# Patient Record
Sex: Male | Born: 1990 | Race: White | Hispanic: No | Marital: Married | Smoking: Former smoker
Health system: Southern US, Community
[De-identification: ages and names within clinical notes are randomized; demographics above are authoritative.]

## PROBLEM LIST (undated history)

## (undated) DIAGNOSIS — E079 Disorder of thyroid, unspecified: Secondary | ICD-10-CM

## (undated) HISTORY — PX: FOOT SURGERY: SHX648

---
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
## 2008-04-07 ENCOUNTER — Ambulatory Visit: Payer: Self-pay | Admitting: Family Medicine

## 2008-04-07 DIAGNOSIS — Z9889 Other specified postprocedural states: Secondary | ICD-10-CM | POA: Insufficient documentation

## 2008-04-08 ENCOUNTER — Telehealth (INDEPENDENT_AMBULATORY_CARE_PROVIDER_SITE_OTHER): Payer: Self-pay | Admitting: *Deleted

## 2008-04-22 ENCOUNTER — Encounter (INDEPENDENT_AMBULATORY_CARE_PROVIDER_SITE_OTHER): Payer: Self-pay | Admitting: Family Medicine

## 2010-08-15 ENCOUNTER — Emergency Department: Payer: Self-pay | Admitting: Emergency Medicine

## 2010-08-20 NOTE — Discharge Summary (Signed)
NAME:  Luis Bennett, Luis Bennett           ACCOUNT NO.:  [id number]   MEDICAL RECORD NO.:  [id number]          PATIENT TYPE:  INP   LOCATION:  6114                         FACILITY:  [facility]   PHYSICIAN:  Phineas Semen, P.A.   DATE OF BIRTH:  [birth year]   DATE OF ADMISSION:  03/12/2004  DATE OF DISCHARGE:  03/14/2004                                 DISCHARGE SUMMARY   ATTENDING PHYSICIAN:  Jimmye Norman, MD.   FINAL DIAGNOSES:  1.  Fall.  2.  Small splenic laceration.   HISTORY:  This is a 20 year old [place] male, who was running when he tripped  and fell in a hole.  He was brought to [facility] because he  was having abdominal pain.  Workup was performed and CT scans were done.  He  was noted to have a small splenic laceration and an L3 transverse process  fracture.  The patient was subsequently hospitalized.  Serial H&H's were  performed, which remained within normal range.  His hemoglobin was 14.3 and  hematocrit 42.4 on the 10th.  His hemoglobin was 13.2 and hematocrit 38.3 at  the time of discharge.  Overnight, the patient did well.  No significant  tenderness noted, and on exam in the morning, he was nontender.  The patient  had been up and walking about and had been tolerating p.o.'s without  difficulty.  At this point, he wanted to go home.  It was stressed with the  patient and the patient's mother that the patient should avoid any running,  heavy lifting, or any physical contact with other friends.  It was discussed  with that patient that if he should have any abdominal pain or feel like he  is going to faint that he should go to the emergency room right away.  At  this point, we will discharge him.  He will follow up on Tuesday the 13th of  December.  He is given a note for school to stay out of Physical Education  for four weeks.  He is subsequently discharged at this time in a  satisfactory, stable condition.       CL/MEDQ  D:  03/14/2004  T:  03/14/2004  Job:   [id number]   cc:   Jimmye Norman III, M.D.  [address]  [place]  [place] [address]  Fax: [phone]

## 2010-09-17 ENCOUNTER — Emergency Department: Payer: Self-pay | Admitting: Internal Medicine

## 2011-04-09 ENCOUNTER — Emergency Department: Payer: Self-pay | Admitting: Emergency Medicine

## 2011-04-15 ENCOUNTER — Ambulatory Visit: Payer: Self-pay | Admitting: Podiatry

## 2011-04-19 LAB — WOUND CULTURE

## 2012-05-13 ENCOUNTER — Encounter (HOSPITAL_COMMUNITY): Payer: Self-pay | Admitting: Emergency Medicine

## 2012-05-13 ENCOUNTER — Emergency Department (HOSPITAL_COMMUNITY)
Admission: EM | Admit: 2012-05-13 | Discharge: 2012-05-13 | Disposition: A | Discharge status: Home / Self Care | Payer: Self-pay | Attending: Emergency Medicine | Admitting: Emergency Medicine

## 2012-05-13 DIAGNOSIS — K089 Disorder of teeth and supporting structures, unspecified: Secondary | ICD-10-CM | POA: Insufficient documentation

## 2012-05-13 DIAGNOSIS — F17200 Nicotine dependence, unspecified, uncomplicated: Secondary | ICD-10-CM | POA: Insufficient documentation

## 2012-05-13 DIAGNOSIS — K0889 Other specified disorders of teeth and supporting structures: Secondary | ICD-10-CM

## 2012-05-13 DIAGNOSIS — G8911 Acute pain due to trauma: Secondary | ICD-10-CM | POA: Insufficient documentation

## 2012-05-13 DIAGNOSIS — R221 Localized swelling, mass and lump, neck: Secondary | ICD-10-CM | POA: Insufficient documentation

## 2012-05-13 DIAGNOSIS — R220 Localized swelling, mass and lump, head: Secondary | ICD-10-CM | POA: Insufficient documentation

## 2012-05-13 MED ORDER — OXYCODONE-ACETAMINOPHEN 5-325 MG PO TABS
1.0000 | ORAL_TABLET | Freq: Once | ORAL | Status: AC
  Administered 2012-05-13: 1 via ORAL
  Filled 2012-05-13: qty 1

## 2012-05-13 MED ORDER — HYDROCODONE-ACETAMINOPHEN 5-325 MG PO TABS: 2.0000 | ORAL_TABLET | ORAL | Status: DC | PRN

## 2012-05-13 MED ORDER — PENICILLIN V POTASSIUM 500 MG PO TABS: 500.0000 mg | ORAL_TABLET | Freq: Four times a day (QID) | ORAL | Status: AC
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
## 2012-05-14 NOTE — ED Provider Notes (Signed)
History/physical exam/procedure(s) were performed by non-physician practitioner and as supervising physician I was immediately available for consultation/collaboration. I have reviewed all notes and am in agreement with care and plan.   Shalona Harbour S Gohan Collister, MD 05/14/12 1548 
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
## 2015-01-30 DIAGNOSIS — K5909 Other constipation: Secondary | ICD-10-CM | POA: Insufficient documentation

## 2015-01-30 DIAGNOSIS — E039 Hypothyroidism, unspecified: Secondary | ICD-10-CM | POA: Insufficient documentation

## 2017-07-07 ENCOUNTER — Ambulatory Visit (INDEPENDENT_AMBULATORY_CARE_PROVIDER_SITE_OTHER): Payer: BLUE CROSS/BLUE SHIELD | Admitting: Physician Assistant

## 2017-07-07 ENCOUNTER — Encounter: Payer: Self-pay | Admitting: Physician Assistant

## 2017-07-07 VITALS — BP 122/82 | HR 68 | Temp 98.3°F | Resp 16 | Ht 72.0 in | Wt 222.0 lb

## 2017-07-07 DIAGNOSIS — Z23 Encounter for immunization: Secondary | ICD-10-CM

## 2017-07-07 DIAGNOSIS — Z114 Encounter for screening for human immunodeficiency virus [HIV]: Secondary | ICD-10-CM | POA: Diagnosis not present

## 2017-07-07 DIAGNOSIS — E039 Hypothyroidism, unspecified: Secondary | ICD-10-CM

## 2017-07-07 DIAGNOSIS — D649 Anemia, unspecified: Secondary | ICD-10-CM

## 2017-07-07 DIAGNOSIS — R1084 Generalized abdominal pain: Secondary | ICD-10-CM

## 2017-07-07 DIAGNOSIS — Z13220 Encounter for screening for lipoid disorders: Secondary | ICD-10-CM

## 2017-07-07 DIAGNOSIS — Z131 Encounter for screening for diabetes mellitus: Secondary | ICD-10-CM | POA: Diagnosis not present

## 2017-07-07 NOTE — Patient Instructions (Signed)
Take Zantac 150 mg one half hour before breakfast and dinner.  Hypothyroidism Hypothyroidism is a disorder of the thyroid. The thyroid is a large gland that is located in the lower front of the neck. The thyroid releases hormones that control how the body works. With hypothyroidism, the thyroid does not make enough of these hormones. What are the causes? Causes of hypothyroidism may include:  Viral infections.  Pregnancy.  Your own defense system (immune system) attacking your thyroid.  Certain medicines.  Birth defects.  Past radiation treatments to your head or neck.  Past treatment with radioactive iodine.  Past surgical removal of part or all of your thyroid.  Problems with the gland that is located in the center of your brain (pituitary).  What are the signs or symptoms? Signs and symptoms of hypothyroidism may include:  Feeling as though you have no energy (lethargy).  Inability to tolerate cold.  Weight gain that is not explained by a change in diet or exercise habits.  Dry skin.  Coarse hair.  Menstrual irregularity.  Slowing of thought processes.  Constipation.  Sadness or depression.  How is this diagnosed? Your health care provider may diagnose hypothyroidism with blood tests and ultrasound tests. How is this treated? Hypothyroidism is treated with medicine that replaces the hormones that your body does not make. After you begin treatment, it may take several weeks for symptoms to go away. Follow these instructions at home:  Take medicines only as directed by your health care provider.  If you start taking any new medicines, tell your health care provider.  Keep all follow-up visits as directed by your health care provider. This is important. As your condition improves, your dosage needs may change. You will need to have blood tests regularly so that your health care provider can watch your condition. Contact a health care provider if:  Your  symptoms do not get better with treatment.  You are taking thyroid replacement medicine and: ? You sweat excessively. ? You have tremors. ? You feel anxious. ? You lose weight rapidly. ? You cannot tolerate heat. ? You have emotional swings. ? You have diarrhea. ? You feel weak. Get help right away if:  You develop chest pain.  You develop an irregular heartbeat.  You develop a rapid heartbeat. This information is not intended to replace advice given to you by your health care provider. Make sure you discuss any questions you have with your health care provider. Document Released: 03/21/2005 Document Revised: 08/27/2015 Document Reviewed: 08/06/2013 Elsevier Interactive Patient Education  2018 [employer]

## 2017-07-07 NOTE — Progress Notes (Signed)
Patient: Luis Bennett Male    DOB: 17-May-1990   27 y.o.   MRN: [id number] Visit Date: 07/07/2017  Today's Provider: Trey Sailors, PA-C   Chief Complaint  Patient presents with  . Establish Care  . Hypothyroidism   Subjective:    Luis Bennett is a 27 y/o man presenting today to establish care. He was previously seen at [facility], last visit 2017. He is living in [place] with his wife who is pregnant with their first child, due in October 2019. He is a Games developer. He does not recall having a tetanus shot.  He has a history of hypothyroidism. Last TSH 09/2015 from [place] was 7.742 on 100 mcg synthroid. He has currently been out of his synthroid for 2-3 weeks. He reports constipation and sluggishness as side effects.   He also reports abdominal pain. He reports he has had longstanding "stomach pains" since childhood, where food has always hurt him. More recently, he reports immediate epigastric pain after eating, occasionally followed by vomiting several hours later. Denies bloody emesis or diarrhea. He typically experiences these symptoms at night, where he may be eating [employer], [employer], or [employer], however he reports experiencing these symptoms with liquids as well. Denies NSAID use. Has not tried anything for this.  He is due for Tdap today.  Thyroid Problem  Presents for follow-up visit. Symptoms include constipation, diarrhea and fatigue. Patient reports no diaphoresis.          No Known Allergies   Current Outpatient Medications:  .  levothyroxine (SYNTHROID, LEVOTHROID) 100 MCG tablet, TAKE 1 TABLET BY MOUTH IN THE MORNING 30-60 MINUTES BEFORE EATING OR DRINKING, Disp: , Rfl:   Review of Systems  Constitutional: Positive for activity change and fatigue. Negative for appetite change, chills, diaphoresis, fever and unexpected weight change.  HENT: Negative.   Respiratory: Negative.   Cardiovascular: Negative.   Gastrointestinal:  Positive for abdominal distention, abdominal pain, constipation, diarrhea, nausea and vomiting. Negative for anal bleeding, blood in stool and rectal pain.  Endocrine: Negative.   Genitourinary: Negative.   Musculoskeletal: Negative.   Skin: Negative.   Allergic/Immunologic: Negative.   Neurological: Positive for dizziness. Negative for light-headedness and headaches.    Social History   Tobacco Use  . Smoking status: Current Every Day Smoker    Packs/day: 0.50    Types: Cigarettes  . Smokeless tobacco: Never Used  Substance Use Topics  . Alcohol use: No   Objective:   BP 122/82 (BP Location: Right Arm, Patient Position: Sitting, Cuff Size: Large)   Pulse 68   Temp 98.3 F (36.8 C) (Oral)   Resp 16   Ht 6' (1.829 m)   Wt 222 lb (100.7 kg)   BMI 30.11 kg/m  Vitals:   07/07/17 1017  BP: 122/82  Pulse: 68  Resp: 16  Temp: 98.3 F (36.8 C)  TempSrc: Oral  Weight: 222 lb (100.7 kg)  Height: 6' (1.829 m)     Physical Exam  Constitutional: He is oriented to person, place, and time. He appears well-developed and well-nourished.  Neck: No thyromegaly present.  Cardiovascular: Normal rate and regular rhythm.  Pulmonary/Chest: Effort normal and breath sounds normal.  Abdominal: Soft. Bowel sounds are normal.  Neurological: He is alert and oriented to person, place, and time.  Skin: Skin is warm and dry.  Psychiatric: He has a normal mood and affect. His behavior is normal.        Assessment &  Plan:     1. Hypothyroidism, unspecified type  Check TSH, expect this to be elevated. Reinitiate at 100 mcg and will f/u in 6 weeks to titrate medication.  - TSH  2. Generalized abdominal pain  Heartburn vs. Gallstones. Will get RUQ us to r/o gallstones/sludge. Counseled on dietary modifications and use of zantac to treat any heartburn symptoms. However, symptoms have been longstanding, and thus will refer to GI for further workup.  - US Abdomen Limited RUQ; Future -  Ambulatory referral to Gastroenterology  3. Encounter for screening for HIV  - HIV antibody (with reflex)  4. Diabetes mellitus screening  - Comprehensive Metabolic Panel (CMET)  5. Lipid screening  - Lipid Profile  6. Anemia, unspecified type  - CBC with Differential  7. Need for Tdap vaccination  Updated today.  Return in about 6 weeks (around 08/18/2017) for thyroid .  The entirety of the information documented in the History of Present Illness, Review of Systems and Physical Exam were personally obtained by me. Portions of this information were initially documented by Kavin LeechLaura Walsh, CMA and reviewed by me for thoroughness and accuracy.          Trey SailorsAdriana M Pollak, PA-C  [facility]

## 2017-07-10 ENCOUNTER — Telehealth: Payer: Self-pay

## 2017-07-10 ENCOUNTER — Ambulatory Visit
Admission: RE | Admit: 2017-07-10 | Discharge: 2017-07-10 | Disposition: A | Discharge status: Home / Self Care | Payer: BLUE CROSS/BLUE SHIELD | Source: Ambulatory Visit | Attending: Physician Assistant | Admitting: Physician Assistant

## 2017-07-10 DIAGNOSIS — R1084 Generalized abdominal pain: Secondary | ICD-10-CM | POA: Diagnosis not present

## 2017-07-10 NOTE — Telephone Encounter (Signed)
-----   Message from Trey SailorsAdriana M Pollak, [place]PA-C sent at 07/10/2017  1:50 PM EDT ----- Normal US, no gallstones. Please keep follow up with GI.

## 2017-07-10 NOTE — Telephone Encounter (Signed)
Pt advised.   Thanks,   -Brixton Franko  

## 2017-07-11 ENCOUNTER — Telehealth: Payer: Self-pay

## 2017-07-11 ENCOUNTER — Other Ambulatory Visit: Payer: Self-pay | Admitting: Physician Assistant

## 2017-07-11 DIAGNOSIS — E039 Hypothyroidism, unspecified: Secondary | ICD-10-CM

## 2017-07-11 LAB — COMPREHENSIVE METABOLIC PANEL
ALT: 32 IU/L (ref 0–44)
AST: 22 IU/L (ref 0–40)
Albumin/Globulin Ratio: 1.8 (ref 1.2–2.2)
Albumin: 4.5 g/dL (ref 3.5–5.5)
Alkaline Phosphatase: 29 IU/L — ABNORMAL LOW (ref 39–117)
BUN/Creatinine Ratio: 9 (ref 9–20)
BUN: 11 mg/dL (ref 6–20)
Bilirubin Total: 0.3 mg/dL (ref 0.0–1.2)
CO2: 24 mmol/L (ref 20–29)
Calcium: 9.2 mg/dL (ref 8.7–10.2)
Chloride: 103 mmol/L (ref 96–106)
Creatinine, Ser: 1.22 mg/dL (ref 0.76–1.27)
GFR calc Af Amer: 93 mL/min/{1.73_m2} (ref >59)
GFR calc non Af Amer: 80 mL/min/{1.73_m2} (ref >59)
Globulin, Total: 2.5 g/dL (ref 1.5–4.5)
Glucose: 103 mg/dL — ABNORMAL HIGH (ref 65–99)
Potassium: 4.2 mmol/L (ref 3.5–5.2)
Sodium: 141 mmol/L (ref 134–144)
Total Protein: 7 g/dL (ref 6.0–8.5)

## 2017-07-11 LAB — CBC WITH DIFFERENTIAL/PLATELET
Basophils Absolute: 0 10*3/uL (ref 0.0–0.2)
Basos: 0 %
EOS (ABSOLUTE): 0.1 10*3/uL (ref 0.0–0.4)
Eos: 2 %
Hematocrit: 42.7 % (ref 37.5–51.0)
Hemoglobin: 14 g/dL (ref 13.0–17.7)
Immature Grans (Abs): 0 10*3/uL (ref 0.0–0.1)
Immature Granulocytes: 0 %
Lymphocytes Absolute: 1.4 10*3/uL (ref 0.7–3.1)
Lymphs: 25 %
MCH: 30.6 pg (ref 26.6–33.0)
MCHC: 32.8 g/dL (ref 31.5–35.7)
MCV: 93 fL (ref 79–97)
Monocytes Absolute: 0.4 10*3/uL (ref 0.1–0.9)
Monocytes: 6 %
Neutrophils Absolute: 3.7 10*3/uL (ref 1.4–7.0)
Neutrophils: 67 %
Platelets: 138 10*3/uL — ABNORMAL LOW (ref 150–379)
RBC: 4.57 x10E6/uL (ref 4.14–5.80)
RDW: 13.1 % (ref 12.3–15.4)
WBC: 5.6 10*3/uL (ref 3.4–10.8)

## 2017-07-11 LAB — LIPID PANEL
Chol/HDL Ratio: 4.4 ratio (ref 0.0–5.0)
Cholesterol, Total: 194 mg/dL (ref 100–199)
HDL: 44 mg/dL (ref >39)
LDL Calculated: 124 mg/dL — ABNORMAL HIGH (ref 0–99)
Triglycerides: 131 mg/dL (ref 0–149)
VLDL Cholesterol Cal: 26 mg/dL (ref 5–40)

## 2017-07-11 LAB — TSH: TSH: 94.83 u[IU]/mL — ABNORMAL HIGH (ref 0.450–4.500)

## 2017-07-11 LAB — HIV ANTIBODY (ROUTINE TESTING W REFLEX): HIV Screen 4th Generation wRfx: NONREACTIVE

## 2017-07-11 MED ORDER — LEVOTHYROXINE SODIUM 100 MCG PO TABS: ORAL_TABLET | ORAL | 1 refills | Status: DC

## 2017-07-11 NOTE — Telephone Encounter (Signed)
-----   Message from Trey SailorsAdriana M Pollak, [place]PA-C sent at 07/11/2017  9:19 AM EDT ----- TSH very high at 94, will restart synthroid at former dose of 100 mcg but we may need to increase this. Will see him at follow up for repeat lab. Sugar slightly elevated, we can check A1c for diabetes in office next visit. Other labwork normal.

## 2017-07-11 NOTE — Telephone Encounter (Signed)
Pt advised.   Thanks,   -Laura  

## 2017-07-11 NOTE — Addendum Note (Signed)
Addended by: Kavin LeechWALSH, Brenan Modesto E on: 07/11/2017 12:40 PM   Modules accepted: Orders

## 2017-08-03 ENCOUNTER — Ambulatory Visit (INDEPENDENT_AMBULATORY_CARE_PROVIDER_SITE_OTHER): Payer: BLUE CROSS/BLUE SHIELD | Admitting: Gastroenterology

## 2017-08-03 ENCOUNTER — Encounter: Payer: Self-pay | Admitting: Gastroenterology

## 2017-08-03 VITALS — BP 128/75 | HR 77 | Ht 72.0 in | Wt 222.4 lb

## 2017-08-03 DIAGNOSIS — R1013 Epigastric pain: Secondary | ICD-10-CM | POA: Diagnosis not present
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
## 2017-08-15 ENCOUNTER — Ambulatory Visit: Payer: BLUE CROSS/BLUE SHIELD | Admitting: Physician Assistant

## 2017-09-06 ENCOUNTER — Other Ambulatory Visit: Payer: Self-pay | Admitting: Physician Assistant

## 2017-09-06 DIAGNOSIS — E039 Hypothyroidism, unspecified: Secondary | ICD-10-CM

## 2017-09-06 NOTE — Telephone Encounter (Signed)
Apt made for 09/12/2017 at 8:20  Thanks,   -Vernona RiegerLaura

## 2017-09-06 NOTE — Telephone Encounter (Signed)
He needs f/u before more refills, no showed his last OV for thyroid f/u and labs.

## 2017-09-12 ENCOUNTER — Encounter: Payer: Self-pay | Admitting: Physician Assistant

## 2017-09-12 ENCOUNTER — Ambulatory Visit (INDEPENDENT_AMBULATORY_CARE_PROVIDER_SITE_OTHER): Payer: BLUE CROSS/BLUE SHIELD | Admitting: Physician Assistant

## 2017-09-12 VITALS — BP 124/76 | HR 68 | Temp 98.0°F | Resp 16 | Wt 225.0 lb

## 2017-09-12 DIAGNOSIS — E039 Hypothyroidism, unspecified: Secondary | ICD-10-CM

## 2017-09-12 DIAGNOSIS — R739 Hyperglycemia, unspecified: Secondary | ICD-10-CM | POA: Diagnosis not present

## 2017-09-12 NOTE — Progress Notes (Signed)
       Patient: Luis Bennett Male    DOB: 1990-06-06   27 y.o.   MRN: [id number] Visit Date: 09/12/2017  Today's Provider: Trey SailorsAdriana M Pollak, PA-C   Chief Complaint  Patient presents with  . Hypothyroidism    Follow up   Subjective:   Started on 100 mch QD synthroid last month. Taking one every day.Feels better. Due for TSH today.  Also hyperglycemic on fasting labs, needs follow up A1C.  Thyroid Problem  Presents for follow-up visit. Patient reports no anxiety, cold intolerance, constipation, depressed mood, diaphoresis, diarrhea, dry skin, fatigue, hair loss, heat intolerance, hoarse voice, leg swelling, menstrual problem, nail problem, palpitations, tremors, visual change, weight gain or weight loss. The symptoms have been improving.   No Known Allergies   Current Outpatient Medications:  .  levothyroxine (SYNTHROID, LEVOTHROID) 100 MCG tablet, TAKE 1 TABLET BY MOUTH IN THE MORNING 30-60 MINUTES BEFORE EATING OR DRINKING, Disp: 14 tablet, Rfl: 0 .  sulfacetamide (BLEPH-10) 10 % ophthalmic solution, Place two drops into the right eye 4 (four) times daily., Disp: , Rfl:   Review of Systems  Constitutional: Negative.  Negative for diaphoresis, fatigue, weight gain and weight loss.  HENT: Negative for hoarse voice.   Respiratory: Negative.   Cardiovascular: Negative.  Negative for palpitations.  Gastrointestinal: Negative.  Negative for constipation and diarrhea.  Endocrine: Negative for cold intolerance and heat intolerance.  Genitourinary: Negative for menstrual problem.  Musculoskeletal: Negative.   Neurological: Negative for dizziness, tremors, light-headedness and headaches.  Psychiatric/Behavioral: The patient is not nervous/anxious.     Social History   Tobacco Use  . Smoking status: Current Every Day Smoker    Packs/day: 0.50    Types: Cigarettes  . Smokeless tobacco: Never Used  Substance Use Topics  . Alcohol use: Yes    Alcohol/week: 1.8 oz    Types:  3 Cans of beer per week    Comment: Weekly   Objective:   BP 124/76 (BP Location: Right Arm, Patient Position: Sitting, Cuff Size: Large)   Pulse 68   Temp 98 F (36.7 C) (Oral)   Resp 16   Wt 225 lb (102.1 kg)   BMI 30.52 kg/m  Vitals:   09/12/17 0828  BP: 124/76  Pulse: 68  Resp: 16  Temp: 98 F (36.7 C)  TempSrc: Oral  Weight: 225 lb (102.1 kg)     Physical Exam  Constitutional: He is oriented to person, place, and time. He appears well-developed and well-nourished.  Neck: Neck supple. No thyromegaly present.  Cardiovascular: Normal rate and regular rhythm.  Pulmonary/Chest: Effort normal and breath sounds normal.  Neurological: He is alert and oriented to person, place, and time.  Skin: Skin is warm and dry.  Psychiatric: He has a normal mood and affect. His behavior is normal.        Assessment & Plan:     1. Acquired hypothyroidism  TSH 13. Increase from 100 mcg synthroid to 112 mcg synthroid QD.  - TSH  2. Hyperglycemia  - HgB A1c  Return in about 6 weeks (around 10/24/2017) for hypothyroidism .  The entirety of the information documented in the History of Present Illness, Review of Systems and Physical Exam were personally obtained by me. Portions of this information were initially documented by Kavin LeechLaura Walsh, CMA and reviewed by me for thoroughness and accuracy.        Trey SailorsAdriana M Pollak, PA-C  [facility]

## 2017-09-12 NOTE — Patient Instructions (Signed)
Hypothyroidism Hypothyroidism is a disorder of the thyroid. The thyroid is a large gland that is located in the lower front of the neck. The thyroid releases hormones that control how the body works. With hypothyroidism, the thyroid does not make enough of these hormones. What are the causes? Causes of hypothyroidism may include:  Viral infections.  Pregnancy.  Your own defense system (immune system) attacking your thyroid.  Certain medicines.  Birth defects.  Past radiation treatments to your head or neck.  Past treatment with radioactive iodine.  Past surgical removal of part or all of your thyroid.  Problems with the gland that is located in the center of your brain (pituitary).  What are the signs or symptoms? Signs and symptoms of hypothyroidism may include:  Feeling as though you have no energy (lethargy).  Inability to tolerate cold.  Weight gain that is not explained by a change in diet or exercise habits.  Dry skin.  Coarse hair.  Menstrual irregularity.  Slowing of thought processes.  Constipation.  Sadness or depression.  How is this diagnosed? Your health care provider may diagnose hypothyroidism with blood tests and ultrasound tests. How is this treated? Hypothyroidism is treated with medicine that replaces the hormones that your body does not make. After you begin treatment, it may take several weeks for symptoms to go away. Follow these instructions at home:  Take medicines only as directed by your health care provider.  If you start taking any new medicines, tell your health care provider.  Keep all follow-up visits as directed by your health care provider. This is important. As your condition improves, your dosage needs may change. You will need to have blood tests regularly so that your health care provider can watch your condition. Contact a health care provider if:  Your symptoms do not get better with treatment.  You are taking thyroid  replacement medicine and: ? You sweat excessively. ? You have tremors. ? You feel anxious. ? You lose weight rapidly. ? You cannot tolerate heat. ? You have emotional swings. ? You have diarrhea. ? You feel weak. Get help right away if:  You develop chest pain.  You develop an irregular heartbeat.  You develop a rapid heartbeat. This information is not intended to replace advice given to you by your health care provider. Make sure you discuss any questions you have with your health care provider. Document Released: 03/21/2005 Document Revised: 08/27/2015 Document Reviewed: 08/06/2013 Elsevier Interactive Patient Education  2018 [employer] Inc[employer]  

## 2017-09-13 ENCOUNTER — Telehealth: Payer: Self-pay

## 2017-09-13 LAB — HEMOGLOBIN A1C
Est. average glucose Bld gHb Est-mCnc: 100 mg/dL
Hgb A1c MFr Bld: 5.1 % (ref 4.8–5.6)

## 2017-09-13 LAB — TSH: TSH: 13.35 u[IU]/mL — ABNORMAL HIGH (ref 0.450–4.500)

## 2017-09-13 MED ORDER — LEVOTHYROXINE SODIUM 112 MCG PO TABS: 112.0000 ug | ORAL_TABLET | Freq: Every day | ORAL | 0 refills | Status: DC

## 2017-09-13 NOTE — Telephone Encounter (Signed)
Pt advised.   Thanks,   -Laura  

## 2017-09-13 NOTE — Telephone Encounter (Signed)
-----   Message from Trey SailorsAdriana M Pollak, [place]PA-C sent at 09/13/2017 11:24 AM EDT ----- TSH reduced but still high and requires a higher dose of levothyroxine. Will send in 112 mcg daily levothyroxine and see him at follow up in 6 weeks to recheck TSH.

## 2017-10-03 ENCOUNTER — Ambulatory Visit: Payer: BLUE CROSS/BLUE SHIELD | Admitting: Gastroenterology

## 2017-10-31 ENCOUNTER — Ambulatory Visit: Payer: BLUE CROSS/BLUE SHIELD | Admitting: Physician Assistant

## 2017-11-15 ENCOUNTER — Ambulatory Visit: Payer: BLUE CROSS/BLUE SHIELD | Admitting: Gastroenterology

## 2017-12-10 ENCOUNTER — Other Ambulatory Visit: Payer: Self-pay | Admitting: Physician Assistant

## 2017-12-10 DIAGNOSIS — E039 Hypothyroidism, unspecified: Secondary | ICD-10-CM

## 2017-12-11 NOTE — Telephone Encounter (Signed)
Patient needs to schedule follow up with Adriana for o.v. And labs within the next month.

## 2017-12-14 ENCOUNTER — Other Ambulatory Visit: Payer: Self-pay | Admitting: Physician Assistant

## 2017-12-14 DIAGNOSIS — E039 Hypothyroidism, unspecified: Secondary | ICD-10-CM

## 2018-01-11 ENCOUNTER — Other Ambulatory Visit: Payer: Self-pay | Admitting: Family Medicine

## 2018-01-11 DIAGNOSIS — E039 Hypothyroidism, unspecified: Secondary | ICD-10-CM

## 2018-01-11 NOTE — Telephone Encounter (Signed)
Please schedule follow up OV, No showed last appointment for thyroid. No more refills without OV.

## 2018-01-16 NOTE — Telephone Encounter (Signed)
[email or web address]

## 2018-02-12 ENCOUNTER — Other Ambulatory Visit: Payer: Self-pay | Admitting: Physician Assistant

## 2018-02-12 DIAGNOSIS — E039 Hypothyroidism, unspecified: Secondary | ICD-10-CM

## 2018-02-16 ENCOUNTER — Ambulatory Visit (INDEPENDENT_AMBULATORY_CARE_PROVIDER_SITE_OTHER): Payer: BLUE CROSS/BLUE SHIELD | Admitting: Physician Assistant

## 2018-02-16 ENCOUNTER — Encounter: Payer: Self-pay | Admitting: Physician Assistant

## 2018-02-16 VITALS — BP 120/86 | HR 66 | Temp 98.1°F | Resp 16 | Wt 227.0 lb

## 2018-02-16 DIAGNOSIS — E039 Hypothyroidism, unspecified: Secondary | ICD-10-CM

## 2018-02-16 DIAGNOSIS — Z720 Tobacco use: Secondary | ICD-10-CM | POA: Diagnosis not present

## 2018-02-16 DIAGNOSIS — Z23 Encounter for immunization: Secondary | ICD-10-CM

## 2018-02-16 NOTE — Patient Instructions (Signed)
Hypothyroidism Hypothyroidism is a disorder of the thyroid. The thyroid is a large gland that is located in the lower front of the neck. The thyroid releases hormones that control how the body works. With hypothyroidism, the thyroid does not make enough of these hormones. What are the causes? Causes of hypothyroidism may include:  Viral infections.  Pregnancy.  Your own defense system (immune system) attacking your thyroid.  Certain medicines.  Birth defects.  Past radiation treatments to your head or neck.  Past treatment with radioactive iodine.  Past surgical removal of part or all of your thyroid.  Problems with the gland that is located in the center of your brain (pituitary).  What are the signs or symptoms? Signs and symptoms of hypothyroidism may include:  Feeling as though you have no energy (lethargy).  Inability to tolerate cold.  Weight gain that is not explained by a change in diet or exercise habits.  Dry skin.  Coarse hair.  Menstrual irregularity.  Slowing of thought processes.  Constipation.  Sadness or depression.  How is this diagnosed? Your health care provider may diagnose hypothyroidism with blood tests and ultrasound tests. How is this treated? Hypothyroidism is treated with medicine that replaces the hormones that your body does not make. After you begin treatment, it may take several weeks for symptoms to go away. Follow these instructions at home:  Take medicines only as directed by your health care provider.  If you start taking any new medicines, tell your health care provider.  Keep all follow-up visits as directed by your health care provider. This is important. As your condition improves, your dosage needs may change. You will need to have blood tests regularly so that your health care provider can watch your condition. Contact a health care provider if:  Your symptoms do not get better with treatment.  You are taking thyroid  replacement medicine and: ? You sweat excessively. ? You have tremors. ? You feel anxious. ? You lose weight rapidly. ? You cannot tolerate heat. ? You have emotional swings. ? You have diarrhea. ? You feel weak. Get help right away if:  You develop chest pain.  You develop an irregular heartbeat.  You develop a rapid heartbeat. This information is not intended to replace advice given to you by your health care provider. Make sure you discuss any questions you have with your health care provider. Document Released: 03/21/2005 Document Revised: 08/27/2015 Document Reviewed: 08/06/2013 Elsevier Interactive Patient Education  2018 [employer] Inc[employer]  
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
## 2018-02-16 NOTE — Assessment & Plan Note (Addendum)
Continue daily for now. Check TSH and adjust accordingly.  TSH is 13. Increase synthroid to 125 mcg daily and will send this in.

## 2018-02-16 NOTE — Assessment & Plan Note (Signed)
Patient wants to quit cold [place]. Will reassess willingness for smoking cessation at next visit.

## 2018-02-21 LAB — TSH: TSH: 13.06 u[IU]/mL — ABNORMAL HIGH (ref 0.450–4.500)

## 2018-02-21 MED ORDER — LEVOTHYROXINE SODIUM 125 MCG PO TABS: 125.0000 ug | ORAL_TABLET | Freq: Every day | ORAL | 0 refills | Status: DC

## 2018-02-21 NOTE — Progress Notes (Signed)
125 mcg synthroid sent in, please take one tab daily and follow up.

## 2018-02-21 NOTE — Addendum Note (Signed)
Addended by: Trey SailorsPOLLAK, ADRIANA M on: 02/21/2018 11:42 AM   Modules accepted: Orders, Level of Service

## 2018-03-05 ENCOUNTER — Telehealth: Payer: Self-pay

## 2018-03-05 NOTE — Telephone Encounter (Signed)
Agreed - thank you.

## 2018-03-05 NOTE — Telephone Encounter (Signed)
Patient reports that he is having chest pain on the right side of his chest off an on. Reports this morning it started to hurt around 7 am and it went away at 9 am and reports it just started back again at 4 pm. Associated symptoms: Dizziness, lightheaded, no blurry vision. Patient was advised to go to the ED for further evaluation.

## 2018-03-13 ENCOUNTER — Ambulatory Visit (INDEPENDENT_AMBULATORY_CARE_PROVIDER_SITE_OTHER): Payer: BLUE CROSS/BLUE SHIELD | Admitting: Physician Assistant

## 2018-03-13 ENCOUNTER — Encounter: Payer: Self-pay | Admitting: Physician Assistant

## 2018-03-13 VITALS — BP 127/80 | HR 97 | Temp 97.8°F | Resp 16 | Wt 226.0 lb

## 2018-03-13 DIAGNOSIS — R079 Chest pain, unspecified: Secondary | ICD-10-CM

## 2018-03-13 DIAGNOSIS — E039 Hypothyroidism, unspecified: Secondary | ICD-10-CM | POA: Diagnosis not present

## 2018-03-13 DIAGNOSIS — I951 Orthostatic hypotension: Secondary | ICD-10-CM | POA: Diagnosis not present

## 2018-03-13 DIAGNOSIS — R42 Dizziness and giddiness: Secondary | ICD-10-CM | POA: Diagnosis not present

## 2018-03-13 DIAGNOSIS — F419 Anxiety disorder, unspecified: Secondary | ICD-10-CM

## 2018-03-13 NOTE — Progress Notes (Signed)
Patient: Luis Bennett Male    DOB: [birth year]   27 y.o.   MRN: [id number] Visit Date: 03/13/2018  Today's Provider: Trey Sailors, PA-C   Chief Complaint  Patient presents with  . Dizziness   Subjective:    Dizziness  This is a new problem. The current episode started 1 to 4 weeks ago (patient reports that since his Thyroid medication was increased he has been feeling ligheadnessoff an on). The problem occurs every several days. The problem has been unchanged. Pertinent negatives include no chills, congestion or coughing. Associated symptoms comments: Reports that he started googling dizziness and he started to "panic". Nothing aggravates the symptoms. He has tried nothing (Patient reports that he has cut down on the smoking and the chest pain episode that he was having it has been better.) for the symptoms.   Patient called 12/02 about having chest pain on the right side of his chest off and on. He was advised to go to the ER. He did not. Reports chest pain on right side while he was sitting in the car driving in the morning to work. Reports pain came went away and then came back later that night. Did not notice chest pain during activity that improves with rest.   Patient also reports episodes of dizziness and light headedness for 20 seconds randomly. He could be standing up walking. He also noticed it when he went to lie down and do some work under his car. He has never passed out. Denies SOB. Denies the room spinning.    He has recently had his synthroid increased due to elevated TSH and hypothyroidism. He reports he is taking this on an empty stomach with a full glass of orange juice.   GAD 7 : Generalized Anxiety Score 03/13/2018  Nervous, Anxious, on Edge 2  Control/stop worrying 3  Worry too much - different things 3  Trouble relaxing 3  Restless 3  Easily annoyed or irritable 3  Afraid - awful might happen 1  Total GAD 7 Score 18  Anxiety Difficulty Somewhat  difficult       No Known Allergies   Current Outpatient Medications:  .  levothyroxine (SYNTHROID, LEVOTHROID) 125 MCG tablet, Take 1 tablet (125 mcg total) by mouth daily., Disp: 90 tablet, Rfl: 0  Review of Systems  Constitutional: Negative for chills.  HENT: Negative for congestion.   Eyes: Negative for visual disturbance.  Respiratory: Negative for cough.   Neurological: Positive for dizziness and light-headedness.    Social History   Tobacco Use  . Smoking status: Current Every Day Smoker    Packs/day: 0.50    Types: Cigarettes  . Smokeless tobacco: Never Used  Substance Use Topics  . Alcohol use: Yes    Alcohol/week: 3.0 standard drinks    Types: 3 Cans of beer per week    Comment: Weekly   Objective:   BP 127/80 (BP Location: Left Arm, Patient Position: Sitting, Cuff Size: Large)   Pulse 97   Temp 97.8 F (36.6 C) (Oral)   Resp 16   Wt 226 lb (102.5 kg)   SpO2 99%   BMI 30.65 kg/m  Vitals:   03/13/18 1422  BP: 127/80  Pulse: 97  Resp: 16  Temp: 97.8 F (36.6 C)  TempSrc: Oral  SpO2: 99%  Weight: 226 lb (102.5 kg)     Physical Exam Constitutional:      Appearance: Normal appearance.  Cardiovascular:  Rate and Rhythm: Normal rate and regular rhythm.  Pulmonary:     Effort: Pulmonary effort is normal.     Breath sounds: Normal breath sounds.  Skin:    General: Skin is warm and dry.  Neurological:     Mental Status: He is alert and oriented to person, place, and time. Mental status is at baseline.  Psychiatric:        Mood and Affect: Mood is anxious.        Speech: Speech normal.        Behavior: Behavior normal.         Assessment & Plan:     1. Chest pain, unspecified type  His EKG and exam are normal.  - EKG 12-Lead  2. Orthostatic hypotension  Does have some orthostatic hypotension. Encouraged to drink plenty of fluids which patient reports he is not vigilant about.   3. Dizziness  - CBC with Differential - TSH -  Comprehensive Metabolic Panel (CMET)  4. Acquired hypothyroidism  Will check TSH. Counseled he should take this with water and not any source of calcium as it prevents absorption.  5. Anxiety  Patient scores very high on anxiety screening. He frequently asks about signs of cancer and if the blood tests I draw will see lung cancer or any type of cancer. He does continue to smoke. Talked about how anxiety could exacerbate some of these symptoms he is experiencing. Offered medications and counseling, patient declines for now. He wishes to engage in meditation and we talked about different strategies for this, including applications on his phone.  Return in about 6 weeks (around 04/24/2018) for hypothyroidism.  The entirety of the information documented in the History of Present Illness, Review of Systems and Physical Exam were personally obtained by me. Portions of this information were initially documented by Hetty ElyJoseline Rosas, CMA and reviewed by me for thoroughness and accuracy.   I have spent 25 minutes with this patient, >50% of which was spent on counseling and coordination of care.             Trey SailorsAdriana M Pollak, PA-C  [facility]

## 2018-03-13 NOTE — Patient Instructions (Signed)
Please drink lots of fluids!   Orthostatic Hypotension Orthostatic hypotension is a sudden drop in blood pressure that happens when you quickly change positions, such as when you get up from a seated or lying position. Blood pressure is a measurement of how strongly, or weakly, your blood is pressing against the walls of your arteries. Arteries are blood vessels that carry blood from your heart throughout your body. When blood pressure is too low, you may not get enough blood to your brain or to the rest of your organs. This can cause weakness, light-headedness, rapid heartbeat, and fainting. This can last for just a few seconds or for up to a few minutes. Orthostatic hypotension is usually not a serious problem. However, if it happens frequently or gets worse, it may be a sign of something more serious. What are the causes? This condition may be caused by:  Sudden changes in posture, such as standing up quickly after you have been sitting or lying down.  Blood loss.  Loss of body fluids (dehydration).  Heart problems.  Hormone (endocrine) problems.  Pregnancy.  Severe infection.  Lack of certain nutrients.  Severe allergic reactions (anaphylaxis).  Certain medicines, such as blood pressure medicine or medicines that make the body lose excess fluids (diuretics). Sometimes, this condition can be caused by not taking medicine as directed, such as taking too much of a certain medicine.  What increases the risk? Certain factors can make you more likely to develop orthostatic hypotension, including:  Age. Risk increases as you get older.  Conditions that affect the heart or the central nervous system.  Taking certain medicines, such as blood pressure medicine or diuretics.  Being pregnant.  What are the signs or symptoms? Symptoms of this condition may include:  Weakness.  Light-headedness.  Dizziness.  Blurred vision.  Fatigue.  Rapid heartbeat.  Fainting, in severe  cases.  How is this diagnosed? This condition is diagnosed based on:  Your medical history.  Your symptoms.  Your blood pressure measurement. Your health care provider will check your blood pressure when you are: ? Lying down. ? Sitting. ? Standing.  A blood pressure reading is recorded as two numbers, such as "120 over 80" (or 120/80). The first ("top") number is called the systolic pressure. It is a measure of the pressure in your arteries as your heart beats. The second ("bottom") number is called the diastolic pressure. It is a measure of the pressure in your arteries when your heart relaxes between beats. Blood pressure is measured in a unit called mm Hg. Healthy blood pressure for adults is 120/80. If your blood pressure is below 90/60, you may be diagnosed with hypotension. Other information or tests that may be used to diagnose orthostatic hypotension include:  Your other vital signs, such as your heart rate and temperature.  Blood tests.  Tilt table test. For this test, you will be safely secured to a table that moves you from a lying position to an upright position. Your heart rhythm and blood pressure will be monitored during the test.  How is this treated? Treatment for this condition may include:  Changing your diet. This may involve eating more salt (sodium) or drinking more water.  Taking medicines to raise your blood pressure.  Changing the dosage of certain medicines you are taking that might be lowering your blood pressure.  Wearing compression stockings. These stockings help to prevent blood clots and reduce swelling in your legs.  In some cases, you may need  to go to the hospital for:  Fluid replacement. This means you will receive fluids through an IV tube.  Blood replacement. This means you will receive donated blood through an IV tube (transfusion).  Treating an infection or heart problems, if this applies.  Monitoring. You may need to be monitored  while medicines that you are taking wear off.  Follow these instructions at home: Eating and drinking   Drink enough fluid to keep your urine clear or pale yellow.  Eat a healthy diet and follow instructions from your health care provider about eating or drinking restrictions. A healthy diet includes: ? Fresh fruits and vegetables. ? Whole grains. ? Lean meats. ? Low-fat dairy products.  Eat extra salt only as directed. Do not add extra salt to your diet unless your health care provider told you to do that.  Eat frequent, small meals.  Avoid standing up suddenly after eating. Medicines  Take over-the-counter and prescription medicines only as told by your health care provider. ? Follow instructions from your health care provider about changing the dosage of your current medicines, if this applies. ? Do not stop or adjust any of your medicines on your own. General instructions  Wear compression stockings as told by your health care provider.  Get up slowly from lying down or sitting positions. This gives your blood pressure a chance to adjust.  Avoid hot showers and excessive heat as directed by your health care provider.  Return to your normal activities as told by your health care provider. Ask your health care provider what activities are safe for you.  Do not use any products that contain nicotine or tobacco, such as cigarettes and e-cigarettes. If you need help quitting, ask your health care provider.  Keep all follow-up visits as told by your health care provider. This is important. Contact a health care provider if:  You vomit.  You have diarrhea.  You have a fever for more than 2-3 days.  You feel more thirsty than usual.  You feel weak and tired. Get help right away if:  You have chest pain.  You have a fast or irregular heartbeat.  You develop numbness in any part of your body.  You cannot move your arms or your legs.  You have trouble  speaking.  You become sweaty or feel lightheaded.  You faint.  You feel short of breath.  You have trouble staying awake.  You feel confused. This information is not intended to replace advice given to you by your health care provider. Make sure you discuss any questions you have with your health care provider. Document Released: 03/11/2002 Document Revised: 12/08/2015 Document Reviewed: 09/11/2015 Elsevier Interactive Patient Education  2018 [employer]

## 2018-03-14 ENCOUNTER — Telehealth: Payer: Self-pay

## 2018-03-14 LAB — CBC WITH DIFFERENTIAL/PLATELET
Basophils Absolute: 0 10*3/uL (ref 0.0–0.2)
Basos: 1 %
EOS (ABSOLUTE): 0.1 10*3/uL (ref 0.0–0.4)
Eos: 1 %
Hematocrit: 40 % (ref 37.5–51.0)
Hemoglobin: 14.2 g/dL (ref 13.0–17.7)
Immature Grans (Abs): 0.1 10*3/uL (ref 0.0–0.1)
Immature Granulocytes: 1 %
Lymphocytes Absolute: 1.6 10*3/uL (ref 0.7–3.1)
Lymphs: 23 %
MCH: 31.1 pg (ref 26.6–33.0)
MCHC: 35.5 g/dL (ref 31.5–35.7)
MCV: 88 fL (ref 79–97)
Monocytes Absolute: 0.5 10*3/uL (ref 0.1–0.9)
Monocytes: 8 %
Neutrophils Absolute: 4.6 10*3/uL (ref 1.4–7.0)
Neutrophils: 66 %
Platelets: 139 10*3/uL — ABNORMAL LOW (ref 150–450)
RBC: 4.56 x10E6/uL (ref 4.14–5.80)
RDW: 12.2 % — ABNORMAL LOW (ref 12.3–15.4)
WBC: 7 10*3/uL (ref 3.4–10.8)

## 2018-03-14 LAB — COMPREHENSIVE METABOLIC PANEL
ALT: 61 IU/L — ABNORMAL HIGH (ref 0–44)
AST: 42 IU/L — ABNORMAL HIGH (ref 0–40)
Albumin/Globulin Ratio: 2.1 (ref 1.2–2.2)
Albumin: 5 g/dL (ref 3.5–5.5)
Alkaline Phosphatase: 31 IU/L — ABNORMAL LOW (ref 39–117)
BUN/Creatinine Ratio: 11 (ref 9–20)
BUN: 15 mg/dL (ref 6–20)
Bilirubin Total: 0.3 mg/dL (ref 0.0–1.2)
CO2: 24 mmol/L (ref 20–29)
Calcium: 9.9 mg/dL (ref 8.7–10.2)
Chloride: 102 mmol/L (ref 96–106)
Creatinine, Ser: 1.4 mg/dL — ABNORMAL HIGH (ref 0.76–1.27)
GFR calc Af Amer: 78 mL/min/{1.73_m2} (ref >59)
GFR calc non Af Amer: 67 mL/min/{1.73_m2} (ref >59)
Globulin, Total: 2.4 g/dL (ref 1.5–4.5)
Glucose: 84 mg/dL (ref 65–99)
Potassium: 3.9 mmol/L (ref 3.5–5.2)
Sodium: 139 mmol/L (ref 134–144)
Total Protein: 7.4 g/dL (ref 6.0–8.5)

## 2018-03-14 LAB — TSH: TSH: 7.3 u[IU]/mL — ABNORMAL HIGH (ref 0.450–4.500)

## 2018-03-14 NOTE — Telephone Encounter (Signed)
Patient advised as directed below. 

## 2018-03-14 NOTE — Telephone Encounter (Signed)
-----   Message from Trey SailorsAdriana M Pollak, [place] sent at 03/14/2018  8:24 AM EST ----- His blood counts are normal, no anemia or low blood count. TSH is improved at 7.30. Be sure to take the medication with a full glass of water on an empty stomach and NOT orange juice or milk or anything else with calcium. Kidney function is a little low, likely dehydration. Liver enzymes are just slightly elevated. Recommend staying away from alcohol and tylenol. Will keep thyroid medication at same dose for now and follow up in 3 months for labwork to recheck thyroid and liver.

## 2018-05-25 ENCOUNTER — Ambulatory Visit: Payer: Self-pay | Admitting: Physician Assistant

## 2018-05-25 ENCOUNTER — Ambulatory Visit (INDEPENDENT_AMBULATORY_CARE_PROVIDER_SITE_OTHER): Payer: BLUE CROSS/BLUE SHIELD | Admitting: Physician Assistant

## 2018-05-25 ENCOUNTER — Encounter: Payer: Self-pay | Admitting: Physician Assistant

## 2018-05-25 VITALS — BP 120/72 | HR 61 | Temp 98.0°F | Resp 16 | Wt 229.0 lb

## 2018-05-25 DIAGNOSIS — E039 Hypothyroidism, unspecified: Secondary | ICD-10-CM

## 2018-05-25 DIAGNOSIS — Z87891 Personal history of nicotine dependence: Secondary | ICD-10-CM

## 2018-05-25 NOTE — Patient Instructions (Addendum)
Address: [address] [place], [place] [address]  Hours:  Open ? Closes 5PM Phone: [phone]    Hypothyroidism  Hypothyroidism is when the thyroid gland does not make enough of certain hormones (it is underactive). The thyroid gland is a small gland located in the lower front part of the neck, just in front of the windpipe (trachea). This gland makes hormones that help control how the body uses food for energy (metabolism) as well as how the heart and brain function. These hormones also play a role in keeping your bones strong. When the thyroid is underactive, it produces too little of the hormones thyroxine (T4) and triiodothyronine (T3). What are the causes? This condition may be caused by:  Hashimoto's disease. This is a disease in which the body's disease-fighting system (immune system) attacks the thyroid gland. This is the most common cause.  Viral infections.  Pregnancy.  Certain medicines.  Birth defects.  Past radiation treatments to the head or neck for cancer.  Past treatment with radioactive iodine.  Past exposure to radiation in the environment.  Past surgical removal of part or all of the thyroid.  Problems with a gland in the center of the brain (pituitary gland).  Lack of enough iodine in the diet. What increases the risk? You are more likely to develop this condition if:  You are male.  You have a family history of thyroid conditions.  You use a medicine called lithium.  You take medicines that affect the immune system (immunosuppressants). What are the signs or symptoms? Symptoms of this condition include:  Feeling as though you have no energy (lethargy).  Not being able to tolerate cold.  Weight gain that is not explained by a change in diet or exercise habits.  Lack of appetite.  Dry skin.  Coarse hair.  Menstrual irregularity.  Slowing of thought processes.  Constipation.  Sadness or depression. How is this diagnosed? This  condition may be diagnosed based on:  Your symptoms, your medical history, and a physical exam.  Blood tests. You may also have imaging tests, such as an ultrasound or MRI. How is this treated? This condition is treated with medicine that replaces the thyroid hormones that your body does not make. After you begin treatment, it may take several weeks for symptoms to go away. Follow these instructions at home:  Take over-the-counter and prescription medicines only as told by your health care provider.  If you start taking any new medicines, tell your health care provider.  Keep all follow-up visits as told by your health care provider. This is important. ? As your condition improves, your dosage of thyroid hormone medicine may change. ? You will need to have blood tests regularly so that your health care provider can monitor your condition. Contact a health care provider if:  Your symptoms do not get better with treatment.  You are taking thyroid replacement medicine and you: ? Sweat a lot. ? Have tremors. ? Feel anxious. ? Lose weight rapidly. ? Cannot tolerate heat. ? Have emotional swings. ? Have diarrhea. ? Feel weak. Get help right away if you have:  Chest pain.  An irregular heartbeat.  A rapid heartbeat.  Difficulty breathing. Summary  Hypothyroidism is when the thyroid gland does not make enough of certain hormones (it is underactive).  When the thyroid is underactive, it produces too little of the hormones thyroxine (T4) and triiodothyronine (T3).  The most common cause is Hashimoto's disease, a disease in which the body's disease-fighting  system (immune system) attacks the thyroid gland. The condition can also be caused by viral infections, medicine, pregnancy, or past radiation treatment to the head or neck.  Symptoms may include weight gain, dry skin, constipation, feeling as though you do not have energy, and not being able to tolerate cold.  This condition  is treated with medicine to replace the thyroid hormones that your body does not make. This information is not intended to replace advice given to you by your health care provider. Make sure you discuss any questions you have with your health care provider. Document Released: 03/21/2005 Document Revised: 03/01/2017 Document Reviewed: 03/01/2017 Elsevier Interactive Patient Education  2019 [employer].

## 2018-05-25 NOTE — Progress Notes (Signed)
Patient: Luis Bennett Male    DOB: 06-04-1990   28 y.o.   MRN: [id number] Visit Date: 05/25/2018  Today's Provider: Trey Sailors, PA-C   Chief Complaint  Patient presents with  . Hypothyroidism   Subjective:     HPI  Follow up for hypothyroidism  The patient was last seen for this 3 months ago. Changes made at last visit include check labs. Las visit he was taking 125 mcg synthroid and his TSH was high. He was taking his synthroid on an empty stomach but with a full glass of orange juice. Dosage of synthroid remained the same but patient was advised to take synthroid on empty stomach with water, no source of calcium. Patient says he has been doing this. Patient reports quitting smoking as well. Patient denies symptoms of hypothyroidism.  Lab Results  Component Value Date   TSH 7.300 (H) 03/13/2018     He reports excellent compliance with treatment. He feels that condition is Improved. He is not having side effects.   Wt Readings from Last 3 Encounters:  05/25/18 229 lb (103.9 kg)  03/13/18 226 lb (102.5 kg)  02/16/18 227 lb (103 kg)    ------------------------------------------------------------------------------------   No Known Allergies   Current Outpatient Medications:  .  levothyroxine (SYNTHROID, LEVOTHROID) 125 MCG tablet, Take 1 tablet (125 mcg total) by mouth daily., Disp: 90 tablet, Rfl: 0  Review of Systems  Constitutional: Negative for fatigue.  Cardiovascular: Negative.   Endocrine: Negative.  Negative for cold intolerance and heat intolerance.    Social History   Tobacco Use  . Smoking status: Current Every Day Smoker    Packs/day: 0.50    Types: Cigarettes  . Smokeless tobacco: Never Used  Substance Use Topics  . Alcohol use: Yes    Alcohol/week: 3.0 standard drinks    Types: 3 Cans of beer per week    Comment: Weekly      Objective:   BP 120/72 (BP Location: Left Arm, Patient Position: Sitting, Cuff Size: Large)    Pulse 61   Temp 98 F (36.7 C) (Oral)   Resp 16   Wt 229 lb (103.9 kg)   BMI 31.06 kg/m  Vitals:   05/25/18 1309  BP: 120/72  Pulse: 61  Resp: 16  Temp: 98 F (36.7 C)  TempSrc: Oral  Weight: 229 lb (103.9 kg)     Physical Exam Constitutional:      Appearance: Normal appearance. He is normal weight.  Neck:     Thyroid: No thyromegaly.  Cardiovascular:     Rate and Rhythm: Normal rate and regular rhythm.     Heart sounds: Normal heart sounds.  Pulmonary:     Effort: Pulmonary effort is normal.     Breath sounds: Normal breath sounds.  Neurological:     Mental Status: He is oriented to person, place, and time. Mental status is at baseline.  Psychiatric:        Mood and Affect: Mood normal.        Behavior: Behavior normal.         Assessment & Plan    1. Acquired hypothyroidism  Check TSH as below. We do not have lab services today, have directed patient to [facility] or he may come back on Monday to get labs. He is out of his synthroid but I would like to get his lab to make sure we fill the right dose. If stable, will follow up in  6 months to 1 year.   - TSH  2. History of tobacco abuse  He reports he has quit smoking.   Return in about 6 months (around 11/23/2018) for hpothyroidism .  The entirety of the information documented in the History of Present Illness, Review of Systems and Physical Exam were personally obtained by me. Portions of this information were initially documented by Rondel Baton, CMA and reviewed by me for thoroughness and accuracy.        Trey Sailors, PA-C  [facility]

## 2018-05-30 ENCOUNTER — Other Ambulatory Visit: Payer: Self-pay | Admitting: Physician Assistant

## 2018-05-30 ENCOUNTER — Telehealth: Payer: Self-pay | Admitting: *Deleted

## 2018-05-30 DIAGNOSIS — E039 Hypothyroidism, unspecified: Secondary | ICD-10-CM

## 2018-05-30 LAB — TSH: TSH: 18.63 u[IU]/mL — ABNORMAL HIGH (ref 0.450–4.500)

## 2018-05-30 MED ORDER — LEVOTHYROXINE SODIUM 150 MCG PO TABS: 150.0000 ug | ORAL_TABLET | Freq: Every day | ORAL | 3 refills | Status: DC

## 2018-05-30 NOTE — Telephone Encounter (Signed)
Patient was notified of results. Expressed understanding.  

## 2018-05-30 NOTE — Telephone Encounter (Signed)
Patient wanted to know if you think it made any difference on his TSH result because he had missed 5 days of the levothyroxine prior to test? Should he go ahead and start at the 150 mcg dose or 125 mcg? Please advise?

## 2018-05-30 NOTE — Telephone Encounter (Signed)
-----   Message from Trey Sailors, [place] sent at 05/30/2018 12:02 PM EST ----- Levels are actually worse. Will increase levothyroxine to 150 mcg daily. Please do take this on empty stomach 30 min prior to meals with NO source of calcium like juice or milk and NO other vitamins or supplements. Follow up 4 mo.

## 2018-05-31 NOTE — Telephone Encounter (Signed)
Patient advised as below.  

## 2018-05-31 NOTE — Telephone Encounter (Signed)
Hard for me to tell, it probably would make a difference but I don't know if it accounts for all of it. I would still do the 150 as it has always been a little high.

## 2018-09-10 ENCOUNTER — Telehealth: Payer: Self-pay | Admitting: Physician Assistant

## 2018-09-10 DIAGNOSIS — E039 Hypothyroidism, unspecified: Secondary | ICD-10-CM

## 2018-09-10 NOTE — Telephone Encounter (Signed)
Pt needs refill on   Levothyroxine 150 mcg  [facility]

## 2018-09-11 MED ORDER — LEVOTHYROXINE SODIUM 150 MCG PO TABS
150.0000 ug | ORAL_TABLET | Freq: Every day | ORAL | 3 refills | Status: DC
Start: 2018-09-11 — End: 2019-03-18

## 2018-09-13 ENCOUNTER — Other Ambulatory Visit: Payer: Self-pay | Admitting: Physician Assistant

## 2018-09-13 ENCOUNTER — Other Ambulatory Visit: Payer: Self-pay

## 2018-09-13 ENCOUNTER — Ambulatory Visit (INDEPENDENT_AMBULATORY_CARE_PROVIDER_SITE_OTHER): Payer: BC Managed Care – PPO | Admitting: Physician Assistant

## 2018-09-13 ENCOUNTER — Encounter: Payer: Self-pay | Admitting: Physician Assistant

## 2018-09-13 VITALS — BP 127/74 | HR 63 | Temp 98.1°F | Resp 16 | Ht 71.0 in | Wt 222.0 lb

## 2018-09-13 DIAGNOSIS — R748 Abnormal levels of other serum enzymes: Secondary | ICD-10-CM

## 2018-09-13 DIAGNOSIS — L089 Local infection of the skin and subcutaneous tissue, unspecified: Secondary | ICD-10-CM | POA: Diagnosis not present

## 2018-09-13 DIAGNOSIS — E039 Hypothyroidism, unspecified: Secondary | ICD-10-CM | POA: Diagnosis not present

## 2018-09-13 DIAGNOSIS — L858 Other specified epidermal thickening: Secondary | ICD-10-CM | POA: Diagnosis not present

## 2018-09-13 DIAGNOSIS — B360 Pityriasis versicolor: Secondary | ICD-10-CM

## 2018-09-13 DIAGNOSIS — R42 Dizziness and giddiness: Secondary | ICD-10-CM

## 2018-09-13 MED ORDER — AMOXICILLIN-POT CLAVULANATE 875-125 MG PO TABS: 1.0000 | ORAL_TABLET | Freq: Two times a day (BID) | ORAL | 0 refills | Status: AC

## 2018-09-13 NOTE — Progress Notes (Signed)
Patient: Luis BankerChristopher B Azam Male    DOB: 1990/11/13   28 y.o.   MRN: [id number] Visit Date: 09/13/2018  Today's Provider: Trey SailorsAdriana M Kymberlyn Eckford, PA-C   Chief Complaint  Patient presents with  . Rash   Subjective:     HPI   Umbilical Rash: Patient c/o rash on belly button x's several days. patient reports itching, pain and discharge yesterday. Denies fevers, chills, abdominal pain, nausea, vomiting. Concerned it may be colon cancer.   Skin Rash: Concerned about the bumps on his arms and that they may be acne.  Skin Discoloration: Reports he may have vitiligo. Reports that when he tans in the summer he gets Ganser spots on his chest and neck.  Hypothyroidism: Levothyroxine was increased in 05/2018 to 150 mcg daily due to uncontrolled hypothyroidism. He is now taking this on an empty stomach with only water.   Lab Results  Component Value Date   TSH 18.630 (H) 05/29/2018    No Known Allergies   Current Outpatient Medications:  .  levothyroxine (SYNTHROID) 150 MCG tablet, Take 1 tablet (150 mcg total) by mouth daily., Disp: 90 tablet, Rfl: 3  Review of Systems  Constitutional: Negative.   Respiratory: Negative.   Skin: Positive for rash.    Social History   Tobacco Use  . Smoking status: Current Every Day Smoker    Packs/day: 0.50    Types: Cigarettes  . Smokeless tobacco: Never Used  Substance Use Topics  . Alcohol use: Yes    Alcohol/week: 3.0 standard drinks    Types: 3 Cans of beer per week    Comment: Weekly      Objective:   BP 127/74 (BP Location: Left Arm, Patient Position: Sitting, Cuff Size: Large)   Pulse 63   Temp 98.1 F (36.7 C) (Oral)   Resp 16   Ht 5\' 11"  (1.803 m)   Wt 222 lb (100.7 kg)   SpO2 97%   BMI 30.96 kg/m  Vitals:   09/13/18 0914  BP: 127/74  Pulse: 63  Resp: 16  Temp: 98.1 F (36.7 C)  TempSrc: Oral  SpO2: 97%  Weight: 222 lb (100.7 kg)  Height: 5\' 11"  (1.803 m)     Physical Exam Constitutional:    Appearance: Normal appearance.  Cardiovascular:     Rate and Rhythm: Normal rate and regular rhythm.     Heart sounds: Normal heart sounds.  Pulmonary:     Effort: Pulmonary effort is normal.     Breath sounds: Normal breath sounds.  Abdominal:    Skin:    General: Skin is warm and dry.     Comments: Rough elevated papules on bilateral upper arms.   Neurological:     Mental Status: He is alert and oriented to person, place, and time. Mental status is at baseline.         Assessment & Plan    1. Skin infection  - amoxicillin-clavulanate (AUGMENTIN) 875-125 MG tablet; Take 1 tablet by mouth 2 (two) times daily for 7 days.  Dispense: 14 tablet; Refill: 0  2. Acquired hypothyroidism  Will adjust pending labwork.   - TSH  3. Keratosis pilaris  Counseled on using Eucerin cream.  4. Tinea versicolor  Counseled on using ketoconazole 2% shampoo as body wash 3X/week.  5. Elevated liver enzymes  - Comprehensive Metabolic Panel (CMET)  6. Dizziness  - CBC with Differential  The entirety of the information documented in the History of Present Illness, Review  of Systems and Physical Exam were personally obtained by me. Portions of this information were initially documented by Lynford Humphrey, CMA and reviewed by me for thoroughness and accuracy.   F/u 3 months hypothyroid.     Trinna Post, PA-C  [facility]

## 2018-09-13 NOTE — Patient Instructions (Addendum)
Nizoral shampoo 2%: use this as a body wash 3x per week   Hypothyroidism  Hypothyroidism is when the thyroid gland does not make enough of certain hormones (it is underactive). The thyroid gland is a small gland located in the lower front part of the neck, just in front of the windpipe (trachea). This gland makes hormones that help control how the body uses food for energy (metabolism) as well as how the heart and brain function. These hormones also play a role in keeping your bones strong. When the thyroid is underactive, it produces too little of the hormones thyroxine (T4) and triiodothyronine (T3). What are the causes? This condition may be caused by:  Hashimoto's disease. This is a disease in which the body's disease-fighting system (immune system) attacks the thyroid gland. This is the most common cause.  Viral infections.  Pregnancy.  Certain medicines.  Birth defects.  Past radiation treatments to the head or neck for cancer.  Past treatment with radioactive iodine.  Past exposure to radiation in the environment.  Past surgical removal of part or all of the thyroid.  Problems with a gland in the center of the brain (pituitary gland).  Lack of enough iodine in the diet. What increases the risk? You are more likely to develop this condition if:  You are male.  You have a family history of thyroid conditions.  You use a medicine called lithium.  You take medicines that affect the immune system (immunosuppressants). What are the signs or symptoms? Symptoms of this condition include:  Feeling as though you have no energy (lethargy).  Not being able to tolerate cold.  Weight gain that is not explained by a change in diet or exercise habits.  Lack of appetite.  Dry skin.  Coarse hair.  Menstrual irregularity.  Slowing of thought processes.  Constipation.  Sadness or depression. How is this diagnosed? This condition may be diagnosed based on:  Your  symptoms, your medical history, and a physical exam.  Blood tests. You may also have imaging tests, such as an ultrasound or MRI. How is this treated? This condition is treated with medicine that replaces the thyroid hormones that your body does not make. After you begin treatment, it may take several weeks for symptoms to go away. Follow these instructions at home:  Take over-the-counter and prescription medicines only as told by your health care provider.  If you start taking any new medicines, tell your health care provider.  Keep all follow-up visits as told by your health care provider. This is important. ? As your condition improves, your dosage of thyroid hormone medicine may change. ? You will need to have blood tests regularly so that your health care provider can monitor your condition. Contact a health care provider if:  Your symptoms do not get better with treatment.  You are taking thyroid replacement medicine and you: ? Sweat a lot. ? Have tremors. ? Feel anxious. ? Lose weight rapidly. ? Cannot tolerate heat. ? Have emotional swings. ? Have diarrhea. ? Feel weak. Get help right away if you have:  Chest pain.  An irregular heartbeat.  A rapid heartbeat.  Difficulty breathing. Summary  Hypothyroidism is when the thyroid gland does not make enough of certain hormones (it is underactive).  When the thyroid is underactive, it produces too little of the hormones thyroxine (T4) and triiodothyronine (T3).  The most common cause is Hashimoto's disease, a disease in which the body's disease-fighting system (immune system) attacks the thyroid  gland. The condition can also be caused by viral infections, medicine, pregnancy, or past radiation treatment to the head or neck.  Symptoms may include weight gain, dry skin, constipation, feeling as though you do not have energy, and not being able to tolerate cold.  This condition is treated with medicine to replace the  thyroid hormones that your body does not make. This information is not intended to replace advice given to you by your health care provider. Make sure you discuss any questions you have with your health care provider. Document Released: 03/21/2005 Document Revised: 03/01/2017 Document Reviewed: 03/01/2017 Elsevier Interactive Patient Education  2019 [employer]

## 2018-09-14 ENCOUNTER — Telehealth: Payer: Self-pay

## 2018-09-14 LAB — CBC WITH DIFFERENTIAL/PLATELET
Basophils Absolute: 0 10*3/uL (ref 0.0–0.2)
Basos: 0 %
EOS (ABSOLUTE): 0.1 10*3/uL (ref 0.0–0.4)
Eos: 2 %
Hematocrit: 44.6 % (ref 37.5–51.0)
Hemoglobin: 15.3 g/dL (ref 13.0–17.7)
Immature Grans (Abs): 0 10*3/uL (ref 0.0–0.1)
Immature Granulocytes: 0 %
Lymphocytes Absolute: 1.3 10*3/uL (ref 0.7–3.1)
Lymphs: 22 %
MCH: 30 pg (ref 26.6–33.0)
MCHC: 34.3 g/dL (ref 31.5–35.7)
MCV: 88 fL (ref 79–97)
Monocytes Absolute: 0.5 10*3/uL (ref 0.1–0.9)
Monocytes: 8 %
Neutrophils Absolute: 4 10*3/uL (ref 1.4–7.0)
Neutrophils: 68 %
Platelets: 141 10*3/uL — ABNORMAL LOW (ref 150–450)
RBC: 5.1 x10E6/uL (ref 4.14–5.80)
RDW: 12 % (ref 11.6–15.4)
WBC: 6 10*3/uL (ref 3.4–10.8)

## 2018-09-14 LAB — COMPREHENSIVE METABOLIC PANEL
ALT: 31 IU/L (ref 0–44)
AST: 23 IU/L (ref 0–40)
Albumin/Globulin Ratio: 1.9 (ref 1.2–2.2)
Albumin: 4.7 g/dL (ref 4.1–5.2)
Alkaline Phosphatase: 27 IU/L — ABNORMAL LOW (ref 39–117)
BUN/Creatinine Ratio: 11 (ref 9–20)
BUN: 13 mg/dL (ref 6–20)
Bilirubin Total: 0.6 mg/dL (ref 0.0–1.2)
CO2: 22 mmol/L (ref 20–29)
Calcium: 9.6 mg/dL (ref 8.7–10.2)
Chloride: 102 mmol/L (ref 96–106)
Creatinine, Ser: 1.14 mg/dL (ref 0.76–1.27)
GFR calc Af Amer: 100 mL/min/{1.73_m2} (ref >59)
GFR calc non Af Amer: 86 mL/min/{1.73_m2} (ref >59)
Globulin, Total: 2.5 g/dL (ref 1.5–4.5)
Glucose: 95 mg/dL (ref 65–99)
Potassium: 4.4 mmol/L (ref 3.5–5.2)
Sodium: 139 mmol/L (ref 134–144)
Total Protein: 7.2 g/dL (ref 6.0–8.5)

## 2018-09-14 LAB — TSH: TSH: 2.64 u[IU]/mL (ref 0.450–4.500)

## 2018-09-14 NOTE — Telephone Encounter (Signed)
[facility] 

## 2018-09-14 NOTE — Telephone Encounter (Signed)
-----   Message from Trinna Post, [place] sent at 09/14/2018  8:18 AM EDT ----- CBC normal, no infection. CMET normal, liver enzymes went down. TSH normal range. Can schedule f/u in 6 months for TSH.

## 2018-09-18 NOTE — Telephone Encounter (Signed)
Patient advised as below.  

## 2018-09-28 ENCOUNTER — Ambulatory Visit: Payer: Self-pay | Admitting: Physician Assistant

## 2019-03-18 ENCOUNTER — Ambulatory Visit: Payer: Self-pay | Admitting: Physician Assistant

## 2019-03-18 ENCOUNTER — Other Ambulatory Visit: Payer: Self-pay

## 2019-03-18 ENCOUNTER — Encounter: Payer: Self-pay | Admitting: Physician Assistant

## 2019-03-18 ENCOUNTER — Ambulatory Visit (INDEPENDENT_AMBULATORY_CARE_PROVIDER_SITE_OTHER): Payer: BC Managed Care – PPO | Admitting: Physician Assistant

## 2019-03-18 VITALS — BP 124/70 | HR 72 | Temp 97.1°F | Wt 228.0 lb

## 2019-03-18 DIAGNOSIS — L858 Other specified epidermal thickening: Secondary | ICD-10-CM | POA: Diagnosis not present

## 2019-03-18 DIAGNOSIS — E039 Hypothyroidism, unspecified: Secondary | ICD-10-CM

## 2019-03-18 DIAGNOSIS — R519 Headache, unspecified: Secondary | ICD-10-CM | POA: Diagnosis not present

## 2019-03-18 MED ORDER — LEVOTHYROXINE SODIUM 150 MCG PO TABS: 150.0000 ug | ORAL_TABLET | Freq: Every day | ORAL | 1 refills | Status: DC

## 2019-03-18 NOTE — Progress Notes (Signed)
Luis Bennett  MRN: [id number] DOB: [birth year]  Subjective:  HPI   Hypothyroidism-The patient is a 28 year old male who presents for follow up of his hypothyroidism.  He was last seen on 09/13/18 and his TSH was back to normal at 2.640 which was down from 18.630 previously.  Patient is on Levothyroxine 150 mcg and states he has been feeling well.  His energy level is good.  His only complaint is that he hsa been having headacues for about 2 months.  Patient reports eliminating soda x 2 weeks and red bulls x 1 month ago.   Keratosis pilaris: has tried one jar of gold bond without success. Rash is still bothersome to him.   Patient Active Problem List   Diagnosis Date Noted  . Tobacco abuse 02/16/2018  . Acquired hypothyroidism 01/30/2015  . Chronic constipation 01/30/2015  . TYMPANOSTOMY TUBES, HX OF 04/07/2008  . OTITIS MEDIA, RIGHT 12/23/2007    No past medical history on file.  Social History   Socioeconomic History  . Marital status: Married    Spouse name: Not on file  . Number of children: Not on file  . Years of education: Not on file  . Highest education level: Not on file  Occupational History  . Not on file  Tobacco Use  . Smoking status: Current Every Day Smoker    Packs/day: 0.50    Types: Cigarettes  . Smokeless tobacco: Never Used  Substance and Sexual Activity  . Alcohol use: Yes    Alcohol/week: 3.0 standard drinks    Types: 3 Cans of beer per week    Comment: Weekly  . Drug use: No  . Sexual activity: Yes  Other Topics Concern  . Not on file  Social History Narrative  . Not on file   Social Determinants of Health   Financial Resource Strain:   . Difficulty of Paying Living Expenses: Not on file  Food Insecurity:   . Worried About Programme researcher, broadcasting/film/video in the Last Year: Not on file  . Ran Out of Food in the Last Year: Not on file  Transportation Needs:   . Lack of Transportation (Medical): Not on file  . Lack of Transportation  (Non-Medical): Not on file  Physical Activity:   . Days of Exercise per Week: Not on file  . Minutes of Exercise per Session: Not on file  Stress:   . Feeling of Stress : Not on file  Social Connections:   . Frequency of Communication with Friends and Family: Not on file  . Frequency of Social Gatherings with Friends and Family: Not on file  . Attends Religious Services: Not on file  . Active Member of Clubs or Organizations: Not on file  . Attends Banker Meetings: Not on file  . Marital Status: Not on file  Intimate Partner Violence:   . Fear of Current or Ex-Partner: Not on file  . Emotionally Abused: Not on file  . Physically Abused: Not on file  . Sexually Abused: Not on file    Outpatient Encounter Medications as of 03/18/2019  Medication Sig  . levothyroxine (SYNTHROID) 150 MCG tablet Take 1 tablet (150 mcg total) by mouth daily.  . [DISCONTINUED] levothyroxine (SYNTHROID) 150 MCG tablet Take 1 tablet (150 mcg total) by mouth daily.   No facility-administered encounter medications on file as of 03/18/2019.    No Known Allergies  Review of Systems  Constitutional: Negative for chills, diaphoresis, fever and malaise/fatigue.  HENT:  Negative for congestion, ear pain, sinus pain and sore throat.   Respiratory: Negative for cough and shortness of breath.   Cardiovascular: Negative for chest pain.  Gastrointestinal: Negative for abdominal pain and diarrhea.  Musculoskeletal: Negative for myalgias.  Neurological: Positive for headaches.    Objective:  BP 124/70 (BP Location: Right Arm, Patient Position: Sitting, Cuff Size: Normal)   Pulse 72   Temp (!) 97.1 F (36.2 C) (Skin)   Wt 228 lb (103.4 kg)   SpO2 92%   BMI 31.80 kg/m   Physical Exam  Constitutional: He is oriented to person, place, and time and well-developed, well-nourished, and in no distress. No distress.  Neck: No thyroid mass and no thyromegaly present.  Cardiovascular: Normal rate and  regular rhythm.  Pulmonary/Chest: Effort normal and breath sounds normal.  Neurological: He is alert and oriented to person, place, and time.  Skin: Skin is dry.  Psychiatric: Affect and judgment normal.    Assessment and Plan :  1. Acquired hypothyroidism  Stable last check. If normal, will follow up in one year. If abnormal, will adjust accordingly.   - TSH - levothyroxine (SYNTHROID) 150 MCG tablet; Take 1 tablet (150 mcg total) by mouth daily.  Dispense: 90 tablet; Refill: 1  2. Keratosis pilaris  Try 2% salicylic acid body wash in shower. If not effective, will send in adapalene 0.1% cream.   3. Increased frequency of headaches  Likely from caffeine withdrawal. Patient reports he was drinking 3-4 sodas per day and 2 red bulls per day. Now only drinking a cup of coffee.   The entirety of the information documented in the History of Present Illness, Review of Systems and Physical Exam were personally obtained by me. Portions of this information were initially documented by Vena Rua, CMA and reviewed by me for thoroughness and accuracy.

## 2019-03-18 NOTE — Patient Instructions (Addendum)
Salicylic Acid 2% body wash in the shower Adapalene 0.1% cream  Analgesic Rebound Headache An analgesic rebound headache, sometimes called a medication overuse headache, is a headache that comes after pain medicine (analgesic) taken to treat the original (primary) headache has worn off. Any type of primary headache can return as a rebound headache if a person regularly takes analgesics more than three times a week to treat it. The types of primary headaches that are commonly associated with rebound headaches include:  Migraines.  Headaches that arise from tense muscles in the head and neck area (tension headaches).  Headaches that develop and happen again (recur) on one side of the head and around the eye (cluster headaches). If rebound headaches continue, they become chronic daily headaches. What are the causes? This condition may be caused by frequent use of:  Over-the-counter medicines such as aspirin, ibuprofen, and acetaminophen.  Sinus relief medicines and other medicines that contain caffeine.  Narcotic pain medicines such as codeine and oxycodone. What are the signs or symptoms? The symptoms of a rebound headache are the same as the symptoms of the original headache. Some of the symptoms of specific types of headaches include: Migraine headache  Pulsing or throbbing pain on one or both sides of the head.  Severe pain that interferes with daily activities.  Pain that is worsened by physical activity.  Nausea, vomiting, or both.  Pain with exposure to bright light, loud noises, or strong smells.  General sensitivity to bright light, loud noises, or strong smells.  Visual changes.  Numbness of one or both arms. Tension headache  Pressure around the head.  Dull, aching head pain.  Pain felt over the front and sides of the head.  Tenderness in the muscles of the head, neck, and shoulders. Cluster headache  Severe pain that begins in or around one eye or  temple.  Redness and tearing in the eye on the same side as the pain.  Droopy or swollen eyelid.  One-sided head pain.  Nausea.  Runny nose.  Sweaty, pale facial skin.  Restlessness. How is this diagnosed? This condition is diagnosed by:  Reviewing your medical history. This includes the nature of your primary headaches.  Reviewing the types of pain medicines that you have been using to treat your headaches and how often you take them. How is this treated? This condition may be treated or managed by:  Discontinuing frequent use of the analgesic medicine. Doing this may worsen your headaches at first, but the pain should eventually become more manageable, less frequent, and less severe.  Seeing a headache specialist. He or she may be able to help you manage your headaches and help make sure there is not another cause of the headaches.  Using methods of stress relief, such as acupuncture, counseling, biofeedback, and massage. Talk with your health care provider about which methods might be good for you. Follow these instructions at home:  Take over-the-counter and prescription medicines only as told by your health care provider.  Stop the repeated use of pain medicine as told by your health care provider. Stopping can be difficult. Carefully follow instructions from your health care provider.  Avoid triggers that are known to cause your primary headaches.  Keep all follow-up visits as told by your health care provider. This is important. Contact a health care provider if:  You continue to experience headaches after following treatments that your health care provider recommended. Get help right away if:  You develop new headache pain.  You develop headache pain that is different than what you have experienced in the past.  You develop numbness or tingling in your arms or legs.  You develop changes in your speech or vision. This information is not intended to replace  advice given to you by your health care provider. Make sure you discuss any questions you have with your health care provider. Document Released: 06/11/2003 Document Revised: 03/03/2017 Document Reviewed: 08/24/2015 Elsevier Patient Education  2020 [employer].

## 2019-03-19 LAB — TSH: TSH: 3.84 u[IU]/mL (ref 0.450–4.500)

## 2019-07-31 ENCOUNTER — Ambulatory Visit (INDEPENDENT_AMBULATORY_CARE_PROVIDER_SITE_OTHER): Payer: BC Managed Care – PPO | Admitting: Physician Assistant

## 2019-07-31 ENCOUNTER — Encounter: Payer: Self-pay | Admitting: Physician Assistant

## 2019-07-31 ENCOUNTER — Other Ambulatory Visit: Payer: Self-pay

## 2019-07-31 VITALS — BP 132/80 | HR 74 | Temp 97.1°F | Resp 16 | Wt 226.0 lb

## 2019-07-31 DIAGNOSIS — M79601 Pain in right arm: Secondary | ICD-10-CM | POA: Diagnosis not present

## 2019-07-31 NOTE — Progress Notes (Signed)
    I,Luis Bennett,acting as a scribe for Luis Sailors, PA-C.,have documented all relevant documentation on the behalf of Luis Sailors, PA-C,as directed by  Luis Sailors, PA-C while in the presence of Luis Sailors, PA-C.  Established patient visit   Patient: Luis Bennett   DOB: 1990-12-30   29 y.o. Male  MRN: [id number] Visit Date: 07/31/2019  Today's healthcare provider: Trey Sailors, PA-C   Chief Complaint  Patient presents with  . Arm Pain   Subjective    Arm Pain  Incident onset: 2 days ago. Injury mechanism: lifting a couch and caught it with his right arm as it was falling. The pain is present in the upper right arm. Quality: stretching pain. The pain is at a severity of 2/10. Pertinent negatives include no chest pain. Exacerbated by: straightening the arm. He has tried rest and NSAIDs (Icy hot patches) for the symptoms. The treatment provided moderate relief.        Medications: Outpatient Medications Prior to Visit  Medication Sig  . levothyroxine (SYNTHROID) 150 MCG tablet Take 1 tablet (150 mcg total) by mouth daily.   No facility-administered medications prior to visit.    Review of Systems  Constitutional: Negative for appetite change, chills and fever.  Respiratory: Negative for chest tightness, shortness of breath and wheezing.   Cardiovascular: Negative for chest pain and palpitations.  Gastrointestinal: Negative for abdominal pain, nausea and vomiting.  Musculoskeletal: Positive for myalgias.      Objective    BP 132/80 (BP Location: Left Arm, Cuff Size: Normal)   Pulse 74   Temp (!) 97.1 F (36.2 C) (Temporal)   Resp 16   Wt 226 lb (102.5 kg)   SpO2 97%   BMI 31.52 kg/m    Physical Exam Constitutional:      Appearance: Normal appearance.  Musculoskeletal:        General: Tenderness (tender to palpation on right anterior portion of bicep) present.     Right upper arm: Tenderness present.     Left upper arm:  Normal.     Comments: Reduced ROM in right arm. Palpable mass just superior to antecubital fossa. Sensation intact BUE.   Skin:    General: Skin is warm and dry.     Capillary Refill: Capillary refill takes less than 2 seconds. Hand cap refill < 2 seconds Neurological:     Mental Status: He is alert and oriented to person, place, and time. Mental status is at baseline.  Psychiatric:        Mood and Affect: Mood normal.        Behavior: Behavior normal.       No results found for any visits on 07/31/19.  Assessment & Plan     1. Right arm pain New. Suspect possible bicep tendon rupture. Will refer to ortho for further evaluation. 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  Avoid heavy lifting. He declines pain medication at this time.  - Ambulatory referral to Orthopedic Surgery  Return if symptoms worsen or fail to improve.      ITrey Sailors, PA-C, have reviewed all documentation for this visit. The documentation on 07/31/19 for the exam, diagnosis, procedures, and orders are all accurate and complete.    Maryella Shivers  [facility] [phone] (phone) [phone] (fax)  [facility]

## 2020-01-27 ENCOUNTER — Ambulatory Visit: Payer: Self-pay | Admitting: *Deleted

## 2020-01-27 NOTE — Telephone Encounter (Signed)
  C/o throat irritation and feels like swelling in throat. C/o dry throat and constant spitting. Describes as big lump in throat. Denies sore throat, difficulty breathing, no swallowing difficulties. C/o headaches at times, "hurts in my eyes" and eye twitches at times. Tested for covid 3 weeks ago and negative due to c/o sinus infection. Denies nasal drainage, no pain. Virtual appt made for 01/29/20 due to covid criteria for appt. Care advise given. Patient verbalized understanding of care advise and to call back or go to [facility] or ED if symptoms worsen.   Answer Assessment - Initial Assessment Questions 1. ONSET: "When did the throat start hurting?" (Hours or days ago)      No pain in throat only swelling and irritation reported 2. SEVERITY: "How bad is the sore throat?" (Scale 1-10; mild, moderate or severe)   - MILD (1-3):  doesn't interfere with eating or normal activities   - MODERATE (4-7): interferes with eating some solids and normal activities   - SEVERE (8-10):  excruciating pain, interferes with most normal activities   - SEVERE DYSPHAGIA: can't swallow liquids, drooling     Mild  3. STREP EXPOSURE: "Has there been any exposure to strep within the past week?" If Yes, ask: "What type of contact occurred?"      No  4.  VIRAL SYMPTOMS: "Are there any symptoms of a cold, such as a runny nose, cough, hoarse voice or red eyes?"      No , headaches and no sore throat 5. FEVER: "Do you have a fever?" If Yes, ask: "What is your temperature, how was it measured, and when did it start?"     na 6. PUS ON THE TONSILS: "Is there pus on the tonsils in the back of your throat?"     no 7. OTHER SYMPTOMS: "Do you have any other symptoms?" (e.g., difficulty breathing, headache, rash)     Headache, dry throat, constant spitting clear colored spit .  Protocols used: SORE THROAT-A-AH
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
## 2020-01-29 ENCOUNTER — Encounter: Payer: Self-pay | Admitting: Physician Assistant

## 2020-01-29 ENCOUNTER — Telehealth (INDEPENDENT_AMBULATORY_CARE_PROVIDER_SITE_OTHER): Payer: BC Managed Care – PPO | Admitting: Physician Assistant

## 2020-01-29 DIAGNOSIS — E039 Hypothyroidism, unspecified: Secondary | ICD-10-CM

## 2020-01-29 DIAGNOSIS — S99922S Unspecified injury of left foot, sequela: Secondary | ICD-10-CM

## 2020-01-30 LAB — COMPREHENSIVE METABOLIC PANEL
ALT: 40 IU/L (ref 0–44)
AST: 28 IU/L (ref 0–40)
Albumin/Globulin Ratio: 1.9 (ref 1.2–2.2)
Albumin: 4.8 g/dL (ref 4.0–5.0)
Alkaline Phosphatase: 37 IU/L — ABNORMAL LOW (ref 44–121)
BUN/Creatinine Ratio: 10 (ref 9–20)
BUN: 12 mg/dL (ref 6–20)
Bilirubin Total: 0.4 mg/dL (ref 0.0–1.2)
CO2: 23 mmol/L (ref 20–29)
Calcium: 9.6 mg/dL (ref 8.7–10.2)
Chloride: 103 mmol/L (ref 96–106)
Creatinine, Ser: 1.22 mg/dL (ref 0.76–1.27)
GFR calc Af Amer: 91 mL/min/{1.73_m2} (ref >59)
GFR calc non Af Amer: 79 mL/min/{1.73_m2} (ref >59)
Globulin, Total: 2.5 g/dL (ref 1.5–4.5)
Glucose: 89 mg/dL (ref 65–99)
Potassium: 4.1 mmol/L (ref 3.5–5.2)
Sodium: 141 mmol/L (ref 134–144)
Total Protein: 7.3 g/dL (ref 6.0–8.5)

## 2020-01-30 LAB — CBC WITH DIFFERENTIAL/PLATELET
Basophils Absolute: 0 10*3/uL (ref 0.0–0.2)
Basos: 1 %
EOS (ABSOLUTE): 0.1 10*3/uL (ref 0.0–0.4)
Eos: 2 %
Hematocrit: 41.2 % (ref 37.5–51.0)
Hemoglobin: 14.4 g/dL (ref 13.0–17.7)
Immature Grans (Abs): 0 10*3/uL (ref 0.0–0.1)
Immature Granulocytes: 1 %
Lymphocytes Absolute: 1.4 10*3/uL (ref 0.7–3.1)
Lymphs: 25 %
MCH: 31.2 pg (ref 26.6–33.0)
MCHC: 35 g/dL (ref 31.5–35.7)
MCV: 89 fL (ref 79–97)
Monocytes Absolute: 0.5 10*3/uL (ref 0.1–0.9)
Monocytes: 9 %
Neutrophils Absolute: 3.7 10*3/uL (ref 1.4–7.0)
Neutrophils: 62 %
Platelets: 140 10*3/uL — ABNORMAL LOW (ref 150–450)
RBC: 4.61 x10E6/uL (ref 4.14–5.80)
RDW: 12.3 % (ref 11.6–15.4)
WBC: 5.8 10*3/uL (ref 3.4–10.8)

## 2020-01-30 LAB — TSH: TSH: 0.991 u[IU]/mL (ref 0.450–4.500)

## 2020-02-03 ENCOUNTER — Other Ambulatory Visit: Payer: Self-pay

## 2020-02-03 ENCOUNTER — Ambulatory Visit: Payer: BC Managed Care – PPO | Admitting: Podiatry

## 2020-02-03 ENCOUNTER — Ambulatory Visit (INDEPENDENT_AMBULATORY_CARE_PROVIDER_SITE_OTHER): Payer: BC Managed Care – PPO

## 2020-02-03 ENCOUNTER — Encounter: Payer: Self-pay | Admitting: Podiatry

## 2020-02-03 DIAGNOSIS — M19079 Primary osteoarthritis, unspecified ankle and foot: Secondary | ICD-10-CM

## 2020-02-03 DIAGNOSIS — R52 Pain, unspecified: Secondary | ICD-10-CM | POA: Diagnosis not present

## 2020-02-03 DIAGNOSIS — L905 Scar conditions and fibrosis of skin: Secondary | ICD-10-CM

## 2020-02-03 DIAGNOSIS — M205X2 Other deformities of toe(s) (acquired), left foot: Secondary | ICD-10-CM

## 2020-02-03 DIAGNOSIS — G5782 Other specified mononeuropathies of left lower limb: Secondary | ICD-10-CM

## 2020-02-03 DIAGNOSIS — G5792 Unspecified mononeuropathy of left lower limb: Secondary | ICD-10-CM
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
## 2020-02-11 NOTE — Progress Notes (Signed)
Established patient visit   Patient: Luis Bennett   DOB: 1990-04-06   29 y.o. Male  MRN: [id number] Visit Date: 02/12/2020  Today's healthcare provider: Trey Sailors, PA-C   Chief Complaint  Patient presents with  . Hypothyroidism   Subjective    HPI  Hypothyroid, follow-up  Lab Results  Component Value Date   TSH 0.991 01/29/2020   TSH 3.840 03/18/2019   TSH 2.640 09/13/2018   Wt Readings from Last 3 Encounters:  02/12/20 226 lb 12.8 oz (102.9 kg)  07/31/19 226 lb (102.5 kg)  03/18/19 228 lb (103.4 kg)    He was last seen for hypothyroid 2 weeks ago.  Management since that visit includes continue current medication. He reports good compliance with treatment. He is not having side effects.  Patient has quit smoking and started dipping and feels his lump in the throat sensation was related to throat being dry. He has increased his fluid in take and felt improvement.   Symptoms: No change in energy level No constipation  No diarrhea No heat / cold intolerance  No nervousness No palpitations  No weight changes    -----------------------------------------------------------------------------------------        Medications: Outpatient Medications Prior to Visit  Medication Sig  . levothyroxine (SYNTHROID) 150 MCG tablet Take 1 tablet (150 mcg total) by mouth daily.   No facility-administered medications prior to visit.    Review of Systems  Constitutional: Negative.   Respiratory: Negative.   Cardiovascular: Negative.   Hematological: Negative.       Objective    BP 132/70 (BP Location: Left Arm, Patient Position: Sitting, Cuff Size: Large)   Pulse 66   Temp 98.5 F (36.9 C) (Oral)   Wt 226 lb 12.8 oz (102.9 kg)   SpO2 100%   BMI 31.63 kg/m    Physical Exam Constitutional:      Appearance: Normal appearance.  Neck:     Thyroid: No thyroid mass or thyromegaly.  Cardiovascular:     Rate and Rhythm: Normal rate and regular  rhythm.     Heart sounds: Normal heart sounds.  Pulmonary:     Effort: Pulmonary effort is normal.     Breath sounds: Normal breath sounds.  Lymphadenopathy:     Cervical:     Right cervical: No superficial, deep or posterior cervical adenopathy.    Left cervical: No superficial, deep or posterior cervical adenopathy.  Skin:    General: Skin is warm and dry.  Neurological:     General: No focal deficit present.     Mental Status: He is alert and oriented to person, place, and time.  Psychiatric:        Mood and Affect: Mood normal.        Behavior: Behavior normal.       No results found for any visits on 02/12/20.  Assessment & Plan    1. Neck pain  His neck pain has improved and his exam is benign. F/u 1 year.    Return if symptoms worsen or fail to improve.      ITrey Sailors, PA-C, have reviewed all documentation for this visit. The documentation on 02/12/20 for the exam, diagnosis, procedures, and orders are all accurate and complete.  I spent 20 minutes dedicated to the care of this patient on the date of this encounter to include pre-visit review of records, face-to-face time with the patient discussing hypothyroidism, and post visit ordering of testing.  Paulene Floor  [facility] [phone] (phone) [phone] (fax)  [place]

## 2020-02-12 ENCOUNTER — Encounter: Payer: Self-pay | Admitting: Physician Assistant

## 2020-02-12 ENCOUNTER — Other Ambulatory Visit: Payer: Self-pay

## 2020-02-12 ENCOUNTER — Ambulatory Visit (INDEPENDENT_AMBULATORY_CARE_PROVIDER_SITE_OTHER): Payer: BC Managed Care – PPO | Admitting: Physician Assistant

## 2020-02-12 VITALS — BP 132/70 | HR 66 | Temp 98.5°F | Wt 226.8 lb

## 2020-02-12 DIAGNOSIS — M542 Cervicalgia: Secondary | ICD-10-CM | POA: Diagnosis not present

## 2020-02-12 NOTE — Patient Instructions (Addendum)
[facility] Phone: [phone]   Hypothyroidism  Hypothyroidism is when the thyroid gland does not make enough of certain hormones (it is underactive). The thyroid gland is a small gland located in the lower front part of the neck, just in front of the windpipe (trachea). This gland makes hormones that help control how the body uses food for energy (metabolism) as well as how the heart and brain function. These hormones also play a role in keeping your bones strong. When the thyroid is underactive, it produces too little of the hormones thyroxine (T4) and triiodothyronine (T3). What are the causes? This condition may be caused by:  Hashimoto's disease. This is a disease in which the body's disease-fighting system (immune system) attacks the thyroid gland. This is the most common cause.  Viral infections.  Pregnancy.  Certain medicines.  Birth defects.  Past radiation treatments to the head or neck for cancer.  Past treatment with radioactive iodine.  Past exposure to radiation in the environment.  Past surgical removal of part or all of the thyroid.  Problems with a gland in the center of the brain (pituitary gland).  Lack of enough iodine in the diet. What increases the risk? You are more likely to develop this condition if:  You are male.  You have a family history of thyroid conditions.  You use a medicine called lithium.  You take medicines that affect the immune system (immunosuppressants). What are the signs or symptoms? Symptoms of this condition include:  Feeling as though you have no energy (lethargy).  Not being able to tolerate cold.  Weight gain that is not explained by a change in diet or exercise habits.  Lack of appetite.  Dry skin.  Coarse hair.  Menstrual irregularity.  Slowing of thought processes.  Constipation.  Sadness or depression. How is this diagnosed? This condition may be diagnosed based on:  Your symptoms, your  medical history, and a physical exam.  Blood tests. You may also have imaging tests, such as an ultrasound or MRI. How is this treated? This condition is treated with medicine that replaces the thyroid hormones that your body does not make. After you begin treatment, it may take several weeks for symptoms to go away. Follow these instructions at home:  Take over-the-counter and prescription medicines only as told by your health care provider.  If you start taking any new medicines, tell your health care provider.  Keep all follow-up visits as told by your health care provider. This is important. ? As your condition improves, your dosage of thyroid hormone medicine may change. ? You will need to have blood tests regularly so that your health care provider can monitor your condition. Contact a health care provider if:  Your symptoms do not get better with treatment.  You are taking thyroid replacement medicine and you: ? Sweat a lot. ? Have tremors. ? Feel anxious. ? Lose weight rapidly. ? Cannot tolerate heat. ? Have emotional swings. ? Have diarrhea. ? Feel weak. Get help right away if you have:  Chest pain.  An irregular heartbeat.  A rapid heartbeat.  Difficulty breathing. Summary  Hypothyroidism is when the thyroid gland does not make enough of certain hormones (it is underactive).  When the thyroid is underactive, it produces too little of the hormones thyroxine (T4) and triiodothyronine (T3).  The most common cause is Hashimoto's disease, a disease in which the body's disease-fighting system (immune system) attacks the thyroid gland. The condition can also be  caused by viral infections, medicine, pregnancy, or past radiation treatment to the head or neck.  Symptoms may include weight gain, dry skin, constipation, feeling as though you do not have energy, and not being able to tolerate cold.  This condition is treated with medicine to replace the thyroid hormones  that your body does not make. This information is not intended to replace advice given to you by your health care provider. Make sure you discuss any questions you have with your health care provider. Document Revised: 03/03/2017 Document Reviewed: 03/01/2017 Elsevier Patient Education  2020 [employer].

## 2020-02-18 ENCOUNTER — Ambulatory Visit: Payer: Self-pay

## 2020-02-18 ENCOUNTER — Ambulatory Visit: Payer: BC Managed Care – PPO | Admitting: Family Medicine

## 2020-02-18 VITALS — BP 137/77 | HR 106 | Temp 100.0°F | Wt 225.0 lb

## 2020-02-18 DIAGNOSIS — R509 Fever, unspecified: Secondary | ICD-10-CM | POA: Diagnosis not present

## 2020-02-18 DIAGNOSIS — J0110 Acute frontal sinusitis, unspecified: Secondary | ICD-10-CM

## 2020-02-18 DIAGNOSIS — R5383 Other fatigue: Secondary | ICD-10-CM | POA: Diagnosis not present

## 2020-02-18 DIAGNOSIS — R42 Dizziness and giddiness: Secondary | ICD-10-CM

## 2020-02-18 MED ORDER — AMOXICILLIN 875 MG PO TABS: 875.0000 mg | ORAL_TABLET | Freq: Two times a day (BID) | ORAL | 0 refills | Status: DC

## 2020-02-18 NOTE — Telephone Encounter (Signed)
Pt. Reports he started having dizzy spells yesterday. Worse when he turns his head and is walking. Has to sit down "and let it pass." Gets sweaty. No other symptoms. Appointment for today.  Reason for Disposition . [1] MODERATE dizziness (e.g., interferes with normal activities) AND [2] has NOT been evaluated by physician for this  (Exception: dizziness caused by heat exposure, sudden standing, or poor fluid intake)  Answer Assessment - Initial Assessment Questions 1. DESCRIPTION: "Describe your dizziness."     Dizzy 2. LIGHTHEADED: "Do you feel lightheaded?" (e.g., somewhat faint, woozy, weak upon standing)     Yes 3. VERTIGO: "Do you feel like either you or the room is spinning or tilting?" (i.e. vertigo)     No 4. SEVERITY: "How bad is it?"  "Do you feel like you are going to faint?" "Can you stand and walk?"   - MILD: Feels slightly dizzy, but walking normally.   - MODERATE: Feels very unsteady when walking, but not falling; interferes with normal activities (e.g., school, work) .   - SEVERE: Unable to walk without falling, or requires assistance to walk without falling; feels like passing out now.      Moderate 5. ONSET:  "When did the dizziness begin?"     Yesterday 6. AGGRAVATING FACTORS: "Does anything make it worse?" (e.g., standing, change in head position)     Head movement, walking 7. HEART RATE: "Can you tell me your heart rate?" "How many beats in 15 seconds?"  (Note: not all patients can do this)       No 8. CAUSE: "What do you think is causing the dizziness?"     Unsure 9. RECURRENT SYMPTOM: "Have you had dizziness before?" If Yes, ask: "When was the last time?" "What happened that time?"     No 10. OTHER SYMPTOMS: "Do you have any other symptoms?" (e.g., fever, chest pain, vomiting, diarrhea, bleeding)       Sweaty 11. PREGNANCY: "Is there any chance you are pregnant?" "When was your last menstrual period?"       n/a  Protocols used: DIZZINESS [facility]
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
## 2020-02-20 ENCOUNTER — Ambulatory Visit: Payer: Self-pay

## 2020-02-20 ENCOUNTER — Encounter: Payer: Self-pay | Admitting: Family Medicine

## 2020-02-20 LAB — INFLUENZA A AND B

## 2020-02-20 LAB — SARS-COV-2, NAA 2 DAY TAT

## 2020-02-20 LAB — NOVEL CORONAVIRUS, NAA: SARS-CoV-2, NAA: DETECTED — AB

## 2020-02-20 NOTE — Telephone Encounter (Signed)
   CW 2  Luis Bennett "[name]" Male, 29 y.o., Nov 19, 1990 MRN:  [id number] Phone:  [phone] Judie Petit) PCP:  Trey Sailors, PA-C Coverage:  Valinda Hoar [employer] Other Next Appt With Podiatry 05/06/2020 at 1:15 PM Message from Gwenlyn Fudge sent at 02/20/2020 10:04 AM EST  Pts wife called stating that the pt tested positive for covid. She states that she is concerned due to her two year old son and herself and is requesting advice. Please advise.   Call History   Type Contact Phone User  02/20/2020 10:04 AM EST Phone (Incoming) Luis Bennett (Spouse) [phone] Gwenlyn Fudge   Wife concerned because husband tested positive for COVID 19. Reports she is waiting for her son's pediatrician to call back, and she is being tested today. Reviewed home quarantine and care. Verbalizes understanding.

## 2020-02-21 ENCOUNTER — Telehealth (HOSPITAL_COMMUNITY): Payer: Self-pay

## 2020-02-21 NOTE — Telephone Encounter (Signed)
Called to Discuss with patient about Covid symptoms and the use of the monoclonal antibody infusion for those with mild to moderate Covid symptoms and at a high risk of hospitalization.     Pt appears to qualify for this infusion due to co-morbid conditions and/or a member of an at-risk group in accordance with the FDA Emergency Use Authorization.    Risk factor: BMI >25  Pt declined interest in receiving monoclonal antibodies for COVID 19. States he is starting to feel much better, denies further needs.

## 2020-03-16 ENCOUNTER — Other Ambulatory Visit: Payer: Self-pay | Admitting: Physician Assistant

## 2020-03-16 DIAGNOSIS — E039 Hypothyroidism, unspecified: Secondary | ICD-10-CM

## 2020-05-06 ENCOUNTER — Other Ambulatory Visit: Payer: Self-pay

## 2020-05-06 ENCOUNTER — Encounter: Payer: Self-pay | Admitting: Podiatry

## 2020-05-06 ENCOUNTER — Ambulatory Visit (INDEPENDENT_AMBULATORY_CARE_PROVIDER_SITE_OTHER): Payer: BC Managed Care – PPO | Admitting: Podiatry

## 2020-05-06 DIAGNOSIS — R52 Pain, unspecified: Secondary | ICD-10-CM | POA: Diagnosis not present

## 2020-05-06 DIAGNOSIS — G5792 Unspecified mononeuropathy of left lower limb: Secondary | ICD-10-CM

## 2020-05-06 DIAGNOSIS — G5782 Other specified mononeuropathies of left lower limb: Secondary | ICD-10-CM | POA: Diagnosis not present

## 2020-05-06 DIAGNOSIS — M19079 Primary osteoarthritis, unspecified ankle and foot: Secondary | ICD-10-CM

## 2020-05-06 DIAGNOSIS — M205X2 Other deformities of toe(s) (acquired), left foot: Secondary | ICD-10-CM | POA: Diagnosis not present

## 2020-05-06 DIAGNOSIS — L905 Scar conditions and fibrosis of skin: Secondary | ICD-10-CM

## 2020-05-06 NOTE — Progress Notes (Signed)
  Subjective:  Patient ID: Luis Bennett, male    DOB: 07-15-1990,  MRN: [id number]  Chief Complaint  Patient presents with  . Foot Pain    "its doing better"    30 y.o. male returns with the above complaint. History confirmed with patient.  Feels much better after the injection, does not have nightly pain on pain has had 3 or 4 times where it was painful.  Objective:  Physical Exam: warm, good capillary refill, no trophic changes or ulcerative lesions, normal DP and PT pulses and abnormal sensory exam along left hallux with decreased light tough and paresthesias. Left Foot: hallux medal proximal phalanx with hypertrophic and dystrophic disorganized scar and callus, now no longer painful to palpation and manipulation, limited ROM of 1st MTP       Radiographs: X-ray of the left foot: joint space 1st MTP maintained, evidence of prior debridement of medial 1st metatarsal head and proximal phalanx, no retained FB Assessment:   1. Hallux limitus of left foot   2. Osteoarthritis of first metatarsophalangeal joint   3. Nerve entrapment of lower limb, left   4. Painful scar   5. Neuritis of left foot      Plan:  Patient was evaluated and treated and all questions answered.  Discussed with him that the injection appears to have freed up the nerve from the surrounding scar tissue.  Possibly may need to repeat this at some point in the next few months but he has improved significantly today.  Also discussed again that he likely will develop arthritis at some point in his lifetime mass becomes painful we will consider injections and surgical intervention if necessary.  Return in about 3 months (around 08/03/2020).

## 2020-08-03 ENCOUNTER — Ambulatory Visit: Payer: BC Managed Care – PPO | Admitting: Podiatry

## 2020-09-21 ENCOUNTER — Telehealth: Payer: Self-pay | Admitting: Physician Assistant

## 2020-09-21 DIAGNOSIS — E039 Hypothyroidism, unspecified: Secondary | ICD-10-CM

## 2020-09-21 MED ORDER — LEVOTHYROXINE SODIUM 150 MCG PO TABS: 150.0000 ug | ORAL_TABLET | Freq: Every day | ORAL | 0 refills | Status: DC

## 2020-09-21 NOTE — Telephone Encounter (Signed)
[facility] faxed refill request for the following medications:  levothyroxine (SYNTHROID) 150 MCG tablet  Last Rx: 03/16/20 Qty: 90 Refills: 1 LOV: 02/18/20 Please advise. Thanks TNP

## 2020-09-25 ENCOUNTER — Emergency Department
Admission: EM | Admit: 2020-09-25 | Discharge: 2020-09-25 | Disposition: A | Discharge status: Home / Self Care | Payer: BC Managed Care – PPO | Attending: Emergency Medicine | Admitting: Emergency Medicine

## 2020-09-25 ENCOUNTER — Other Ambulatory Visit: Payer: Self-pay

## 2020-09-25 ENCOUNTER — Emergency Department: Payer: BC Managed Care – PPO

## 2020-09-25 DIAGNOSIS — Z79899 Other long term (current) drug therapy: Secondary | ICD-10-CM | POA: Diagnosis not present

## 2020-09-25 DIAGNOSIS — N200 Calculus of kidney: Secondary | ICD-10-CM | POA: Diagnosis not present

## 2020-09-25 DIAGNOSIS — R112 Nausea with vomiting, unspecified: Secondary | ICD-10-CM | POA: Diagnosis present

## 2020-09-25 DIAGNOSIS — Z87891 Personal history of nicotine dependence: Secondary | ICD-10-CM | POA: Insufficient documentation

## 2020-09-25 DIAGNOSIS — E039 Hypothyroidism, unspecified: Secondary | ICD-10-CM | POA: Insufficient documentation

## 2020-09-25 HISTORY — DX: Disorder of thyroid, unspecified: E07.9

## 2020-09-25 LAB — COMPREHENSIVE METABOLIC PANEL
ALT: 36 U/L (ref 0–44)
AST: 33 U/L (ref 15–41)
Albumin: 4.8 g/dL (ref 3.5–5.0)
Alkaline Phosphatase: 27 U/L — ABNORMAL LOW (ref 38–126)
Anion gap: 10 (ref 5–15)
BUN: 16 mg/dL (ref 6–20)
CO2: 22 mmol/L (ref 22–32)
Calcium: 9.5 mg/dL (ref 8.9–10.3)
Chloride: 106 mmol/L (ref 98–111)
Creatinine, Ser: 1.27 mg/dL — ABNORMAL HIGH (ref 0.61–1.24)
GFR, Estimated: >60 mL/min (ref >60)
Glucose, Bld: 136 mg/dL — ABNORMAL HIGH (ref 70–99)
Potassium: 3.9 mmol/L (ref 3.5–5.1)
Sodium: 138 mmol/L (ref 135–145)
Total Bilirubin: 1 mg/dL (ref 0.3–1.2)
Total Protein: 7.9 g/dL (ref 6.5–8.1)

## 2020-09-25 LAB — URINALYSIS, COMPLETE (UACMP) WITH MICROSCOPIC
Bacteria, UA: NONE SEEN
Bilirubin Urine: NEGATIVE
Glucose, UA: NEGATIVE mg/dL
Ketones, ur: NEGATIVE mg/dL
Nitrite: NEGATIVE
Protein, ur: 30 mg/dL — AB
RBC / HPF: >50 RBC/hpf — ABNORMAL HIGH (ref 0–5)
Specific Gravity, Urine: 1.02 (ref 1.005–1.030)
pH: 5 (ref 5.0–8.0)

## 2020-09-25 LAB — CBC WITH DIFFERENTIAL/PLATELET
Abs Immature Granulocytes: 0.05 10*3/uL (ref 0.00–0.07)
Basophils Absolute: 0 10*3/uL (ref 0.0–0.1)
Basophils Relative: 1 %
Eosinophils Absolute: 0.1 10*3/uL (ref 0.0–0.5)
Eosinophils Relative: 2 %
HCT: 44.7 % (ref 39.0–52.0)
Hemoglobin: 16 g/dL (ref 13.0–17.0)
Immature Granulocytes: 1 %
Lymphocytes Relative: 21 %
Lymphs Abs: 1.8 10*3/uL (ref 0.7–4.0)
MCH: 30.9 pg (ref 26.0–34.0)
MCHC: 35.8 g/dL (ref 30.0–36.0)
MCV: 86.3 fL (ref 80.0–100.0)
Monocytes Absolute: 0.6 10*3/uL (ref 0.1–1.0)
Monocytes Relative: 8 %
Neutro Abs: 5.8 10*3/uL (ref 1.7–7.7)
Neutrophils Relative %: 67 %
Platelets: 173 10*3/uL (ref 150–400)
RBC: 5.18 MIL/uL (ref 4.22–5.81)
RDW: 12 % (ref 11.5–15.5)
WBC: 8.5 10*3/uL (ref 4.0–10.5)
nRBC: 0 % (ref 0.0–0.2)

## 2020-09-25 LAB — LIPASE, BLOOD: Lipase: 52 U/L — ABNORMAL HIGH (ref 11–51)

## 2020-09-25 MED ORDER — HYDROMORPHONE HCL 1 MG/ML IJ SOLN
1.0000 mg | Freq: Once | INTRAMUSCULAR | Status: AC
  Administered 2020-09-25: 1 mg via INTRAVENOUS
  Filled 2020-09-25: qty 1

## 2020-09-25 MED ORDER — ONDANSETRON 4 MG PO TBDP: 4.0000 mg | ORAL_TABLET | Freq: Three times a day (TID) | ORAL | 0 refills | Status: DC | PRN

## 2020-09-25 MED ORDER — KETOROLAC TROMETHAMINE 30 MG/ML IJ SOLN
15.0000 mg | Freq: Once | INTRAMUSCULAR | Status: AC
  Administered 2020-09-25: 15 mg via INTRAVENOUS
  Filled 2020-09-25: qty 1

## 2020-09-25 MED ORDER — OXYCODONE HCL 5 MG PO TABS: 5.0000 mg | ORAL_TABLET | Freq: Four times a day (QID) | ORAL | 0 refills | Status: AC | PRN

## 2020-09-25 MED ORDER — TAMSULOSIN HCL 0.4 MG PO CAPS: 0.4000 mg | ORAL_CAPSULE | Freq: Every day | ORAL | 0 refills | Status: AC

## 2020-09-25 MED ORDER — ONDANSETRON HCL 4 MG/2ML IJ SOLN
4.0000 mg | Freq: Once | INTRAMUSCULAR | Status: AC
  Administered 2020-09-25: 4 mg via INTRAVENOUS
  Filled 2020-09-25: qty 2

## 2020-09-25 MED ORDER — SODIUM CHLORIDE 0.9 % IV BOLUS
1000.0000 mL | Freq: Once | INTRAVENOUS | Status: AC
  Administered 2020-09-25: 1000 mL via INTRAVENOUS

## 2020-09-25 NOTE — ED Notes (Signed)
Pt reminded of need for urine sample- pt given warm blankets
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
## 2020-09-25 NOTE — ED Notes (Addendum)
Pt back from CT and requesting more pain medication

## 2020-09-25 NOTE — ED Triage Notes (Signed)
Pt c/o RLQ PAIN radiating to the back since 615am today with diaphoresis, N/V. Denies history of kidney stones.

## 2020-09-25 NOTE — ED Notes (Signed)
Pt taken for CT 

## 2020-09-25 NOTE — Discharge Instructions (Addendum)
You have a kidney stone. See report below.   Take ibuprofen 400mg  every 8 hours daily (as long as you are not on any other blood thinners or have kidney disease) Take tylenol 1g every 8 hours daily. Take oxycodone for breakthrough pain. Do not drive, work, or operate machinery while on this.  Take zofran to help with nausea. Take Flomax to help dilate uretha. Call urology number above to schedule outpatient appointment. Return to ED for fevers, unable to keep food down, or any other concerns.  IMPRESSION:  1. Right-sided hydronephrosis and hydroureter secondary to 3 mm  right UVJ calculus.  2. Splenomegaly.    Take oxycodone as prescribed. Do not drink alcohol, drive or participate in any other potentially dangerous activities while taking this medication as it may make you sleepy. Do not take this medication with any other sedating medications, either prescription or over-the-counter. If you were prescribed Percocet or Vicodin, do not take these with acetaminophen (Tylenol) as it is already contained within these medications.  This medication is an opiate (or narcotic) pain medication and can be habit forming. Use it as little as possible to achieve adequate pain control. Do not use or use it with extreme caution if you have a history of opiate abuse or dependence. If you are on a pain contract with your primary care doctor or a pain specialist, be sure to let them know you were prescribed this medication today from the Emergency Department. This medication is intended for your use only - do not give any to anyone else and keep it in a secure place where nobody else, especially children, have access to it.

## 2020-12-19 ENCOUNTER — Other Ambulatory Visit: Payer: Self-pay | Admitting: Family Medicine

## 2020-12-19 DIAGNOSIS — E039 Hypothyroidism, unspecified: Secondary | ICD-10-CM

## 2020-12-20 NOTE — Telephone Encounter (Signed)
Requested Prescriptions  Pending Prescriptions Disp Refills  . levothyroxine (SYNTHROID) 150 MCG tablet [Pharmacy Med Name: LEVOTHYROXINE 150 MCG TABLET] 90 tablet 0    Sig: TAKE 1 TABLET BY MOUTH EVERY DAY     Endocrinology:  Hypothyroid Agents Failed - 12/19/2020  3:31 PM      Failed - TSH needs to be rechecked within 3 months after an abnormal result. Refill until TSH is due.      Passed - TSH in normal range and within 360 days    TSH  Date Value Ref Range Status  01/29/2020 0.991 0.450 - 4.500 uIU/mL Final         Passed - Valid encounter within last 12 months    Recent Outpatient Visits          10 months ago Fever and chills   [facility] Chrismon, Jodell Cipro, PA-C   10 months ago Neck pain   [facility] Osvaldo Angst M, [place]   10 months ago Acquired hypothyroidism   [facility] [place], [place], [place]   1 year ago Right arm pain   [facility] [place], Lavella Hammock, [place]   1 year ago Acquired hypothyroidism   [facility] Trey Sailors, [place]      Future Appointments            In 1 month Trey Sailors, PA-C [facility]

## 2021-02-11 ENCOUNTER — Encounter: Payer: Self-pay | Admitting: Physician Assistant

## 2021-04-28 ENCOUNTER — Other Ambulatory Visit: Payer: Self-pay | Admitting: Family Medicine

## 2021-04-28 DIAGNOSIS — E039 Hypothyroidism, unspecified: Secondary | ICD-10-CM

## 2021-04-28 MED ORDER — LEVOTHYROXINE SODIUM 150 MCG PO TABS: 150.0000 ug | ORAL_TABLET | Freq: Every day | ORAL | 0 refills | Status: DC

## 2021-04-28 NOTE — Telephone Encounter (Signed)
Medication Refill - Medication: levothyroxine (SYNTHROID) 150 MCG tablet  Has the patient contacted their pharmacy? No. No, more refills.  (Agent: If no, request that the patient contact the pharmacy for the refill. If patient does not wish to contact the pharmacy document the reason why and proceed with request.)   Preferred Pharmacy (with phone number or street name):  [facility] [phone] Hassell Halim [address]  [address] [place] [address]  Phone: [phone] Fax: [phone]  Hours: Not open 24 hours   Has the patient been seen for an appointment in the last year OR does the patient have an upcoming appointment? Yes.   Scheduled appointment for 01/27.   Agent: Please be advised that RX refills may take up to 3 business days. We ask that you follow-up with your pharmacy.

## 2021-04-28 NOTE — Telephone Encounter (Signed)
Appointment 04/30/21 Courtesy RF given #[phone] Requested Prescriptions  Pending Prescriptions Disp Refills   levothyroxine (SYNTHROID) 150 MCG tablet 90 tablet 0    Sig: Take 1 tablet (150 mcg total) by mouth daily.     Endocrinology:  Hypothyroid Agents Failed - 04/28/2021  3:35 PM      Failed - TSH needs to be rechecked within 3 months after an abnormal result. Refill until TSH is due.      Failed - TSH in normal range and within 360 days    TSH  Date Value Ref Range Status  01/29/2020 0.991 0.450 - 4.500 uIU/mL Final         Failed - Valid encounter within last 12 months    Recent Outpatient Visits          1 year ago Fever and chills   [facility] Chrismon, Jodell Cipro, PA-C   1 year ago Neck pain   [facility] [place], Lavella Hammock, [place]   1 year ago Acquired hypothyroidism   [facility] [place], Lavella Hammock, [place]   1 year ago Right arm pain   [facility] Osvaldo Angst M, [place]   2 years ago Acquired hypothyroidism   [facility] Trey Sailors, [place]      Future Appointments            In 2 days Mecum, Oswaldo Conroy, PA-C [facility]

## 2021-04-30 ENCOUNTER — Ambulatory Visit: Payer: 59 | Admitting: Physician Assistant

## 2021-04-30 ENCOUNTER — Other Ambulatory Visit: Payer: Self-pay

## 2021-04-30 ENCOUNTER — Encounter: Payer: Self-pay | Admitting: Physician Assistant

## 2021-04-30 VITALS — BP 128/72 | HR 64 | Temp 97.7°F | Resp 16 | Wt 229.0 lb

## 2021-04-30 DIAGNOSIS — E039 Hypothyroidism, unspecified: Secondary | ICD-10-CM | POA: Diagnosis not present

## 2021-04-30 DIAGNOSIS — Z0189 Encounter for other specified special examinations: Secondary | ICD-10-CM

## 2021-04-30 NOTE — Patient Instructions (Addendum)
° °  In regards to your stomach concerns, please keep a log of your foods and meals and note how you feel after eating each one. Knowing which ones trigger your symptoms can help [place] figure out what's going on.   Come back for your labs in the next two weeks- remember these will need to be fasting (nothing other than water or black coffee for at least 8 hours prior to lab work)  You should have a script available for your thyroid medication. Please pick that up and continue taking as directed.

## 2021-04-30 NOTE — Assessment & Plan Note (Signed)
Chronic, historic problem, stable  Patient recently ran out of Levothyroxine and has not been taking for about one week but called for refill Refill provided on 04-28-2021 and is available for pickup TSH ordered today to complete with other routine labs Follow up in approx 4-5 months with Mikey Kirschner, PA-C to establish PCP and routine follow up

## 2021-04-30 NOTE — Progress Notes (Signed)
I,[name],acting as a scribe for [employer], PA-C.,have documented all relevant documentation on the behalf of Depaul Arizpe E Jalesa Thien, PA-C,as directed by  [employer], PA-C while in the presence of Tajah Schreiner E Maryse Brierley, PA-C.   Established patient visit   Patient: Luis Bennett   DOB: [birth year]   31 y.o. Male  MRN: [id number] Visit Date: 04/30/2021  Today's healthcare provider: Dani Gobble Ilham Roughton, PA-C  Introduced myself to the patient as a Journalist, newspaper and provided education on APPs in clinical practice.    Chief Complaint  Patient presents with   Hypothyroidism   Subjective    HPI  Hypothyroid, follow-up  Lab Results  Component Value Date   TSH 0.991 01/29/2020   TSH 3.840 03/18/2019   TSH 2.640 09/13/2018    Wt Readings from Last 3 Encounters:  04/30/21 229 lb (103.9 kg)  09/25/20 220 lb (99.8 kg)  02/18/20 225 lb (102.1 kg)    He was last seen for hypothyroid on 01/29/2020.   Management since that visit includes continuing same treatment. He reports good compliance with treatment. He is not having side effects.   Symptoms: No change in energy level No constipation  No diarrhea No heat / cold intolerance  No nervousness No palpitations  No weight changes    -----------------------------------------------------------------------------------------   Reports concerns for stomach issues  States he has discomfort when he eats specific foods - describes bloating sensation that resolves after vomiting.  Denies pain but discomfort is prevalent  Has tried Tums and Pepto - no relief     Reports he recently injured his right middle finger- thinks he may have "popped it out of socket" and then manually repositioned.  States his strength is returning slowly but has full ROM - notes decreased grip strength as a whole with right hand Is right hand dominant Offered imaging and referral to hand specialist- declined, in favor of monitoring at this time.     Medications: Outpatient Medications Prior to Visit  Medication Sig   levothyroxine (SYNTHROID) 150 MCG tablet Take 1 tablet (150 mcg total) by mouth daily.   [DISCONTINUED] amoxicillin (AMOXIL) 875 MG tablet Take 1 tablet (875 mg total) by mouth 2 (two) times daily. (Patient not taking: Reported on 04/30/2021)   [DISCONTINUED] ondansetron (ZOFRAN ODT) 4 MG disintegrating tablet Take 1 tablet (4 mg total) by mouth every 8 (eight) hours as needed for nausea or vomiting. (Patient not taking: Reported on 04/30/2021)   No facility-administered medications prior to visit.    Review of Systems  Constitutional:  Negative for appetite change, chills, fatigue and fever.  Eyes:  Negative for discharge, itching and visual disturbance.  Respiratory:  Negative for chest tightness, shortness of breath and wheezing.   Cardiovascular:  Negative for chest pain and palpitations.  Gastrointestinal:  Negative for abdominal pain, constipation, diarrhea, nausea and vomiting.  Endocrine: Negative for cold intolerance and heat intolerance.  Neurological:  Negative for dizziness, weakness, light-headedness, numbness and headaches.  Psychiatric/Behavioral:  Negative for dysphoric mood and sleep disturbance. The patient is not nervous/anxious.       Objective    BP 128/72 (BP Location: Left Arm, Patient Position: Sitting, Cuff Size: Large)    Pulse 64    Temp 97.7 F (36.5 C) (Oral)    Resp 16    Wt 229 lb (103.9 kg)    SpO2 100% Comment: room air   BMI 31.94 kg/m  {Show previous vital signs (optional):23777}  Physical Exam  Vitals reviewed.  Constitutional:      Appearance: Normal appearance.  HENT:     Head: Normocephalic and atraumatic.  Neck:     Thyroid: No thyroid mass, thyromegaly or thyroid tenderness.  Cardiovascular:     Rate and Rhythm: Normal rate and regular rhythm.     Pulses: Normal pulses.     Heart sounds: Normal heart sounds.  Pulmonary:     Effort: Pulmonary effort is normal.      Breath sounds: Normal breath sounds. No wheezing, rhonchi or rales.  Abdominal:     General: Abdomen is flat. Bowel sounds are normal.     Palpations: Abdomen is soft.  Musculoskeletal:     Right hand: Normal strength. Normal capillary refill.     Left hand: Normal strength. Normal capillary refill.     Cervical back: Normal range of motion and neck supple.  Skin:    General: Skin is warm.  Neurological:     Mental Status: He is alert.      No results found for any visits on 04/30/21.  Assessment & Plan     Problem List Items Addressed This Visit       Endocrine   Acquired hypothyroidism - Primary    Chronic, historic problem, stable  Patient recently ran out of Levothyroxine and has not been taking for about one week but called for refill Refill provided on 04-28-2021 and is available for pickup TSH ordered today to complete with other routine labs Follow up in approx 4-5 months with Mikey Kirschner, PA-C to establish PCP and routine follow up        Relevant Orders   TSH   Other Visit Diagnoses     Routine lab draw       Relevant Orders   CBC w/Diff/Platelet   Comprehensive Metabolic Panel (CMET)   Lipid Profile        No follow-ups on file.   I, Ronie Barnhart E Briggett Tuccillo, PA-C, have reviewed all documentation for this visit. The documentation on 04/30/21 for the exam, diagnosis, procedures, and orders are all accurate and complete.   Teresa Lemmerman, [place]ie [place] MPH [place], PA-C  [facility] [phone] (phone) [phone] (fax)  [place]

## 2021-05-22 ENCOUNTER — Other Ambulatory Visit: Payer: Self-pay | Admitting: Family Medicine

## 2021-05-22 DIAGNOSIS — E039 Hypothyroidism, unspecified: Secondary | ICD-10-CM

## 2021-06-28 ENCOUNTER — Other Ambulatory Visit: Payer: Self-pay | Admitting: Family Medicine

## 2021-06-28 DIAGNOSIS — E039 Hypothyroidism, unspecified: Secondary | ICD-10-CM

## 2021-09-22 ENCOUNTER — Ambulatory Visit (INDEPENDENT_AMBULATORY_CARE_PROVIDER_SITE_OTHER): Payer: BLUE CROSS/BLUE SHIELD | Admitting: Physician Assistant

## 2021-09-22 ENCOUNTER — Encounter: Payer: Self-pay | Admitting: Physician Assistant

## 2021-09-22 VITALS — BP 132/83 | HR 66 | Ht 71.0 in | Wt 230.5 lb

## 2021-09-22 DIAGNOSIS — R7989 Other specified abnormal findings of blood chemistry: Secondary | ICD-10-CM | POA: Diagnosis not present

## 2021-09-22 DIAGNOSIS — R739 Hyperglycemia, unspecified: Secondary | ICD-10-CM | POA: Insufficient documentation

## 2021-09-22 DIAGNOSIS — M67431 Ganglion, right wrist: Secondary | ICD-10-CM | POA: Insufficient documentation

## 2021-09-22 DIAGNOSIS — M25562 Pain in left knee: Secondary | ICD-10-CM | POA: Diagnosis not present

## 2021-09-22 DIAGNOSIS — E039 Hypothyroidism, unspecified: Secondary | ICD-10-CM | POA: Diagnosis not present

## 2021-09-22 DIAGNOSIS — Z1159 Encounter for screening for other viral diseases: Secondary | ICD-10-CM

## 2021-09-22 HISTORY — DX: Ganglion, right wrist: M67.431

## 2021-09-22 HISTORY — DX: Hyperglycemia, unspecified: R73.9

## 2021-09-22 NOTE — Assessment & Plan Note (Signed)
Historically, pt states it was the time of a kidney stone Will recheck

## 2021-09-22 NOTE — Assessment & Plan Note (Signed)
Advised repeat drainage of cyst vs surgical removal Can use ice/heat to decrease inflammation Ref to ortho

## 2021-09-22 NOTE — Assessment & Plan Note (Signed)
Unclear etiology may just be overuse Will get xrays

## 2021-09-22 NOTE — Assessment & Plan Note (Signed)
Managing w/ levothyroxine 150 mcg daily Will get tsh/t4, cbc, cmp

## 2021-09-22 NOTE — Assessment & Plan Note (Signed)
Historically, unknown if fasting, will check a1c

## 2021-09-22 NOTE — Progress Notes (Signed)
I,Sha'taria Tyson,acting as a Neurosurgeon for [employer], PA-C.,have documented all relevant documentation on the behalf of Alfredia Ferguson, PA-C,as directed by  Alfredia Ferguson, PA-C while in the presence of Alfredia Ferguson, PA-C.   Established patient visit   Patient: Luis Bennett   DOB: May 02, 1990   31 y.o. Male  MRN: [id number] Visit Date: 09/22/2021  Today's healthcare provider: Alfredia Ferguson, PA-C   Cc. Hypothyroid f/u Subjective    HPI  Hypothyroid, follow-up  Lab Results  Component Value Date   TSH 0.991 01/29/2020   TSH 3.840 03/18/2019   TSH 2.640 09/13/2018    Wt Readings from Last 3 Encounters:  09/22/21 230 lb 8 oz (104.6 kg)  04/30/21 229 lb (103.9 kg)  09/25/20 220 lb (99.8 kg)    He was last seen for hypothyroid 6 months ago.  Management since that visit includes continue current treatment. He reports excellent compliance with treatment. He is not having side effects.  He has been without meds for 2 days.  Symptoms: No change in energy level No constipation  No diarrhea No heat / cold intolerance  No nervousness No palpitations  No weight changes    -----------------------------------------------------------------------------------------  He also reports pain in his left knee with pressure, when he kneels. Feels a bony growth. Denies injury. Denies instability.   He reports a long-standing ganglion cyst in his right wrist, has been drained previously. Reports it is back and painful to touch. Denies associated rash, swelling.   Medications: Outpatient Medications Prior to Visit  Medication Sig   levothyroxine (SYNTHROID) 150 MCG tablet TAKE 1 TABLET BY MOUTH EVERY DAY   No facility-administered medications prior to visit.    Review of Systems  Constitutional:  Negative for fatigue and fever.  Respiratory:  Negative for cough and shortness of breath.   Cardiovascular:  Negative for chest pain, palpitations and leg swelling.   Musculoskeletal:  Positive for arthralgias.  Neurological:  Negative for dizziness and headaches.      Objective    Blood pressure 132/83, pulse 66, height 5\' 11"  (1.803 m), weight 230 lb 8 oz (104.6 kg), SpO2 99 %.   Physical Exam Constitutional:      General: He is awake.     Appearance: He is well-developed.  HENT:     Head: Normocephalic.  Eyes:     Conjunctiva/sclera: Conjunctivae normal.  Cardiovascular:     Rate and Rhythm: Normal rate and regular rhythm.     Heart sounds: Normal heart sounds.  Pulmonary:     Effort: Pulmonary effort is normal.     Breath sounds: Normal breath sounds.  Musculoskeletal:     Comments: 1-2 cm cyst R lateral wrist  L knee w/ slight bony prominence ? Vs normal tibial tuberosity? But tender to touch.   Skin:    General: Skin is warm.  Neurological:     Mental Status: He is alert and oriented to person, place, and time.  Psychiatric:        Attention and Perception: Attention normal.        Mood and Affect: Mood normal.        Speech: Speech normal.        Behavior: Behavior is cooperative.      No results found for any visits on 09/22/21.  Assessment & Plan     Problem List Items Addressed This Visit       Endocrine   Acquired hypothyroidism - Primary    Managing w/  levothyroxine 150 mcg daily Will get tsh/t4, cbc, cmp      Relevant Orders   Comprehensive Metabolic Panel (CMET)   TSH + free T4   CBC w/Diff/Platelet     Other   Elevated serum creatinine    Historically, pt states it was the time of a kidney stone Will recheck      Relevant Orders   Comprehensive Metabolic Panel (CMET)   Hyperglycemia    Historically, unknown if fasting, will check a1c      Relevant Orders   Comprehensive Metabolic Panel (CMET)   HgB A1c   Acute pain of left knee    Unclear etiology may just be overuse Will get xrays      Relevant Orders   DG Knee Complete 4 Views Left   Ganglion cyst of wrist, right    Advised repeat  drainage of cyst vs surgical removal Can use ice/heat to decrease inflammation Ref to ortho       Relevant Orders   Ambulatory referral to Orthopedics   Other Visit Diagnoses     Encounter for hepatitis C screening test for low risk patient       Relevant Orders   Hepatitis C Antibody       Return in about 1 year (around 09/23/2022) for CPE, pending bw.      I, Alfredia Ferguson, PA-C have reviewed all documentation for this visit. The documentation on  @CurDate @  for the exam, diagnosis, procedures, and orders are all accurate and complete.  , PA-C [facility] [address] [place], [place], [place] Office: [phone] Fax: [phone]   [facility]

## 2021-09-23 ENCOUNTER — Other Ambulatory Visit: Payer: Self-pay | Admitting: Physician Assistant

## 2021-09-23 DIAGNOSIS — R7989 Other specified abnormal findings of blood chemistry: Secondary | ICD-10-CM

## 2021-09-23 DIAGNOSIS — D696 Thrombocytopenia, unspecified: Secondary | ICD-10-CM

## 2021-09-23 DIAGNOSIS — E039 Hypothyroidism, unspecified: Secondary | ICD-10-CM

## 2021-09-23 LAB — CBC WITH DIFFERENTIAL/PLATELET
Basophils Absolute: 0 10*3/uL (ref 0.0–0.2)
Basos: 1 %
EOS (ABSOLUTE): 0.1 10*3/uL (ref 0.0–0.4)
Eos: 2 %
Hematocrit: 44 % (ref 37.5–51.0)
Hemoglobin: 14.8 g/dL (ref 13.0–17.7)
Immature Grans (Abs): 0 10*3/uL (ref 0.0–0.1)
Immature Granulocytes: 1 %
Lymphocytes Absolute: 1.6 10*3/uL (ref 0.7–3.1)
Lymphs: 26 %
MCH: 29.9 pg (ref 26.6–33.0)
MCHC: 33.6 g/dL (ref 31.5–35.7)
MCV: 89 fL (ref 79–97)
Monocytes Absolute: 0.5 10*3/uL (ref 0.1–0.9)
Monocytes: 9 %
Neutrophils Absolute: 3.7 10*3/uL (ref 1.4–7.0)
Neutrophils: 61 %
Platelets: 142 10*3/uL — ABNORMAL LOW (ref 150–450)
RBC: 4.95 x10E6/uL (ref 4.14–5.80)
RDW: 12.1 % (ref 11.6–15.4)
WBC: 6.1 10*3/uL (ref 3.4–10.8)

## 2021-09-23 LAB — COMPREHENSIVE METABOLIC PANEL
ALT: 30 IU/L (ref 0–44)
AST: 24 IU/L (ref 0–40)
Albumin/Globulin Ratio: 2.1 (ref 1.2–2.2)
Albumin: 4.9 g/dL (ref 4.0–5.0)
Alkaline Phosphatase: 31 IU/L — ABNORMAL LOW (ref 44–121)
BUN/Creatinine Ratio: 12 (ref 9–20)
BUN: 16 mg/dL (ref 6–20)
Bilirubin Total: 0.3 mg/dL (ref 0.0–1.2)
CO2: 21 mmol/L (ref 20–29)
Calcium: 9.5 mg/dL (ref 8.7–10.2)
Chloride: 103 mmol/L (ref 96–106)
Creatinine, Ser: 1.39 mg/dL — ABNORMAL HIGH (ref 0.76–1.27)
Globulin, Total: 2.3 g/dL (ref 1.5–4.5)
Glucose: 86 mg/dL (ref 70–99)
Potassium: 4.1 mmol/L (ref 3.5–5.2)
Sodium: 141 mmol/L (ref 134–144)
Total Protein: 7.2 g/dL (ref 6.0–8.5)
eGFR: 69 mL/min/{1.73_m2} (ref >59)

## 2021-09-23 LAB — HEPATITIS C ANTIBODY: Hep C Virus Ab: NONREACTIVE

## 2021-09-23 LAB — HEMOGLOBIN A1C
Est. average glucose Bld gHb Est-mCnc: 103 mg/dL
Hgb A1c MFr Bld: 5.2 % (ref 4.8–5.6)

## 2021-09-23 LAB — TSH+FREE T4
Free T4: 1.09 ng/dL (ref 0.82–1.77)
TSH: 5.23 u[IU]/mL — ABNORMAL HIGH (ref 0.450–4.500)

## 2021-09-23 MED ORDER — LEVOTHYROXINE SODIUM 150 MCG PO TABS: 150.0000 ug | ORAL_TABLET | Freq: Every day | ORAL | 3 refills | Status: DC

## 2021-09-29 ENCOUNTER — Telehealth: Payer: Self-pay

## 2021-09-29 NOTE — Telephone Encounter (Signed)
Pt. Given lab results and instructions.Verbalizes understanding. 

## 2021-10-04 ENCOUNTER — Encounter: Payer: Self-pay | Admitting: Physician Assistant

## 2021-10-04 ENCOUNTER — Ambulatory Visit (INDEPENDENT_AMBULATORY_CARE_PROVIDER_SITE_OTHER): Payer: BLUE CROSS/BLUE SHIELD | Admitting: Physician Assistant

## 2021-10-04 VITALS — BP 135/88 | HR 68 | Ht 71.0 in | Wt 228.7 lb

## 2021-10-04 DIAGNOSIS — M278 Other specified diseases of jaws: Secondary | ICD-10-CM | POA: Insufficient documentation

## 2021-10-04 NOTE — Assessment & Plan Note (Signed)
Unilateral lymphadenopathy vs submandibular gland enlargement? Recommending ultrasound.

## 2021-10-04 NOTE — Progress Notes (Signed)
    I,Sha'taria Tyson,acting as a Neurosurgeon for [employer], PA-C.,have documented all relevant documentation on the behalf of Alfredia Ferguson, PA-C,as directed by  Alfredia Ferguson, PA-C while in the presence of Alfredia Ferguson, PA-C.   Acute Office Visit  Subjective:     Patient ID: Luis Bennett, male    DOB: 1991-02-18, 31 y.o.   MRN: [id number]  Cc.lump on left side of jaw   HPI Pt reports he noticed around a week ago. Was initially larger, more vision, sore to touch. Now not as sore but still present. Denies any recent illness, sore throat, excessive salivation or dry mouth.       Objective:    Blood pressure 135/88, pulse 68, height 5\' 11"  (1.803 m), weight 228 lb 11.2 oz (103.7 kg), SpO2 99 %.   Physical Exam Vitals reviewed.  Constitutional:      Appearance: He is not ill-appearing.  HENT:     Head: Normocephalic.     Comments: L jaw around 1 cm from jaw angle there is a 2-3 cm well circumscribed, mobile mass. Eyes:     Conjunctiva/sclera: Conjunctivae normal.  Cardiovascular:     Rate and Rhythm: Normal rate.  Pulmonary:     Effort: Pulmonary effort is normal. No respiratory distress.  Neurological:     General: No focal deficit present.     Mental Status: He is alert and oriented to person, place, and time.  Psychiatric:        Mood and Affect: Mood normal.        Behavior: Behavior normal.     No results found for any visits on 10/04/21.      Assessment & Plan:   Problem List Items Addressed This Visit       Other   Mass of jaw - Primary    Unilateral lymphadenopathy vs submandibular gland enlargement? Recommending ultrasound.      Relevant Orders   12/05/21 Soft Tissue Head/Neck (NON-THYROID)     I, [place], PA-C have reviewed all documentation for this visit. The documentation on  10/04/2021 for the exam, diagnosis, procedures, and orders are all accurate and complete.  12/05/2021, PA-C [facility] [address] [place], [place], [place] Office: [phone] Fax: [phone]

## 2021-10-12 ENCOUNTER — Ambulatory Visit: Payer: BLUE CROSS/BLUE SHIELD | Attending: Physician Assistant

## 2021-11-10 IMAGING — CT CT RENAL STONE PROTOCOL
2 of 4 series · 16 of 46 positions shown, 18 images · non-contrast
Comparison: None

CLINICAL DATA: Flank pain radiating to back. Evaluate for kidney
stone.

EXAM:
CT ABDOMEN AND PELVIS WITHOUT CONTRAST
TECHNIQUE: Multidetector CT imaging of the abdomen and pelvis was performed
following the standard protocol without IV contrast.

[Series 2: stone full standard · axial · 0.71mm/px · z∈[-1086,-622]mm · 13 of 101 slices shown, 15 images]
[im 4/101  soft-tissue]
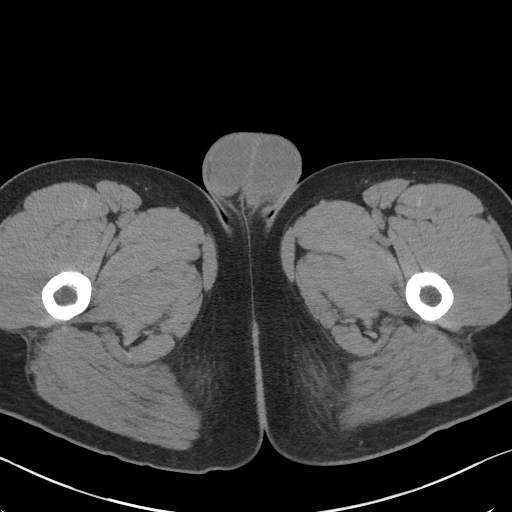
[im 4/101  bone]
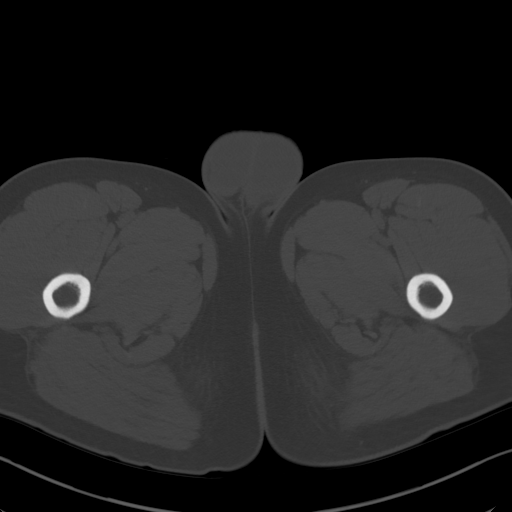
[im 12/101  soft-tissue]
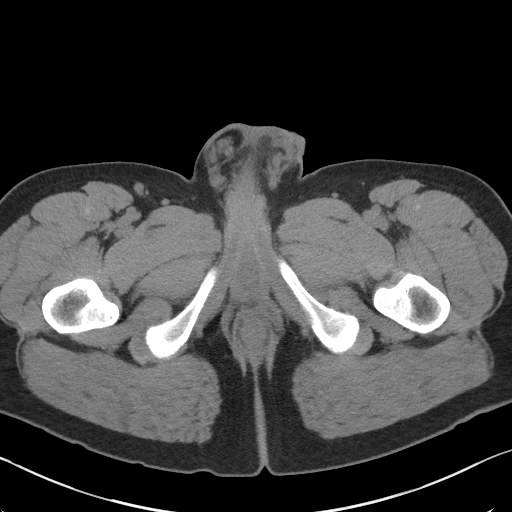
[im 20/101  soft-tissue]
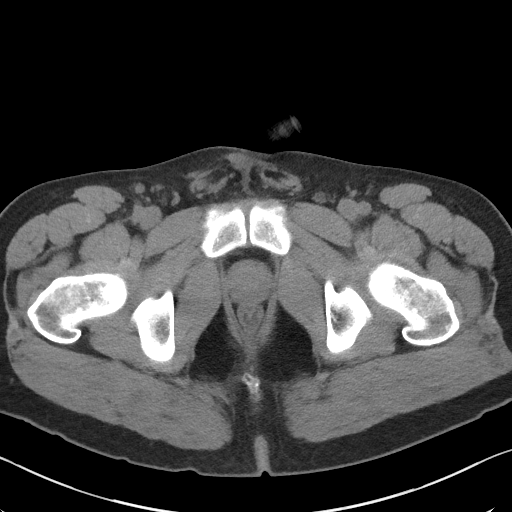
[im 27/101  soft-tissue]
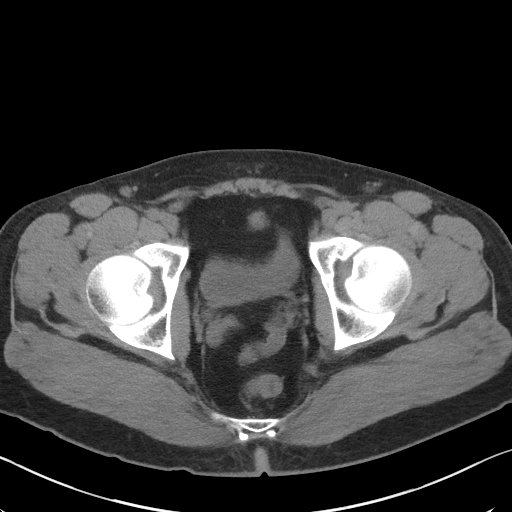
[im 35/101  soft-tissue]
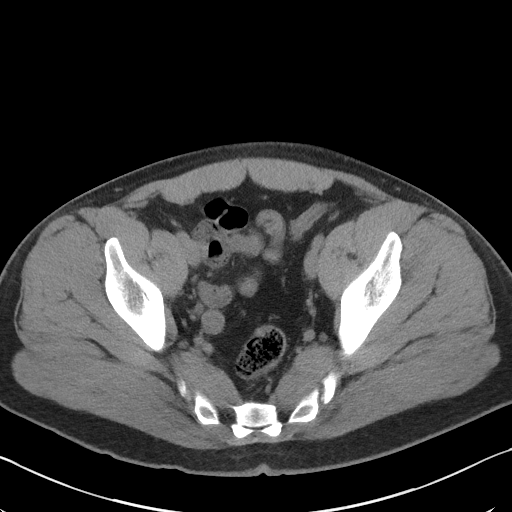
[im 43/101  soft-tissue]
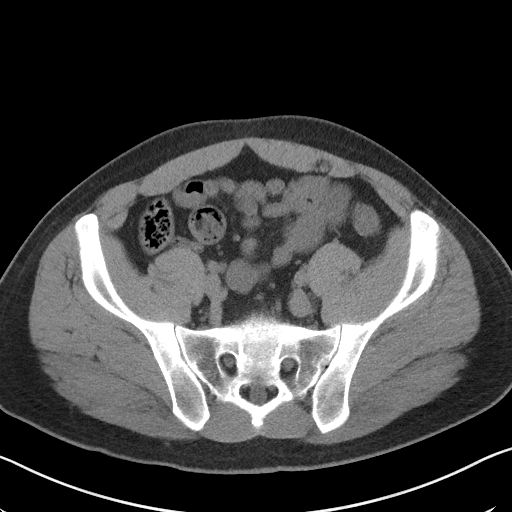
[im 51/101  soft-tissue]
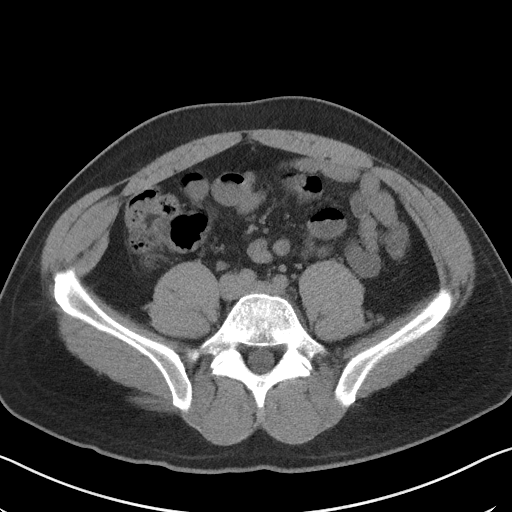
[im 58/101  soft-tissue]
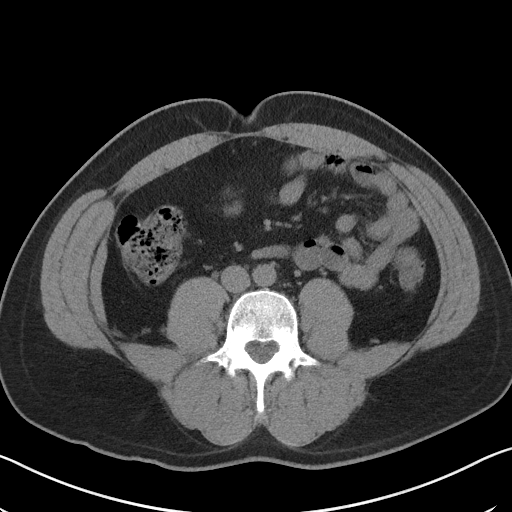
[im 66/101  soft-tissue]
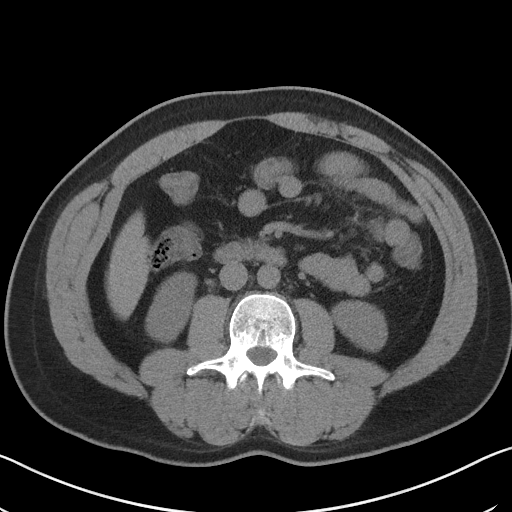
[im 66/101  bone]
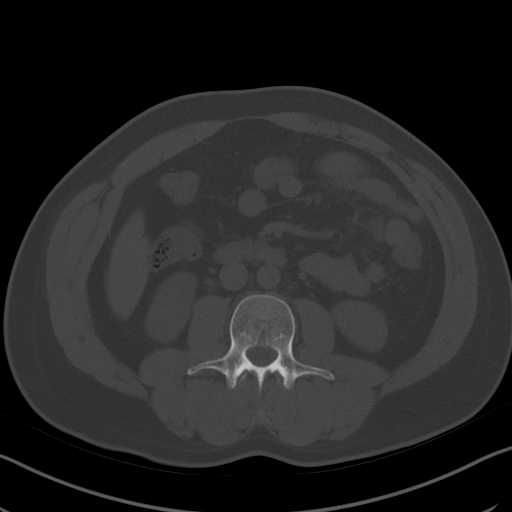
[im 74/101  soft-tissue]
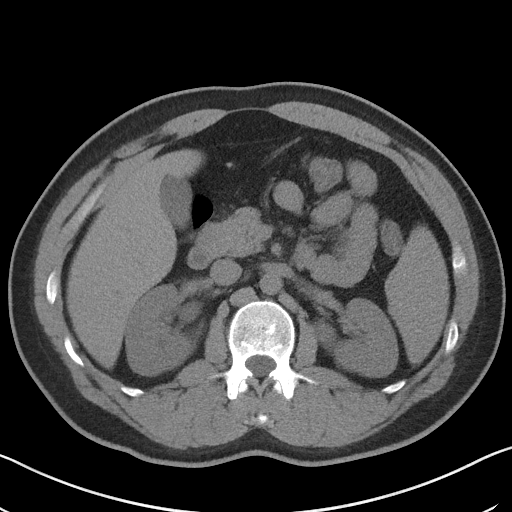
[im 81/101  soft-tissue]
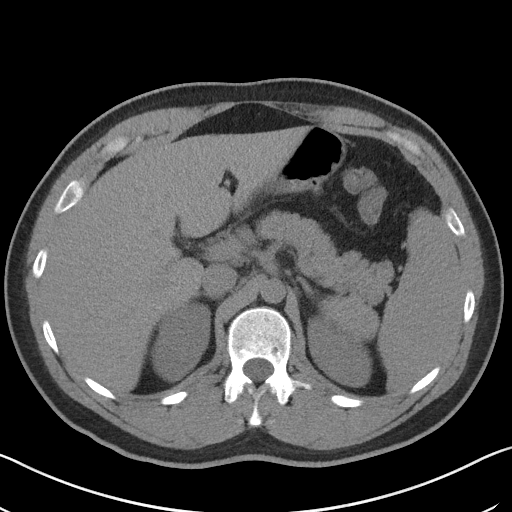
[im 89/101  soft-tissue]
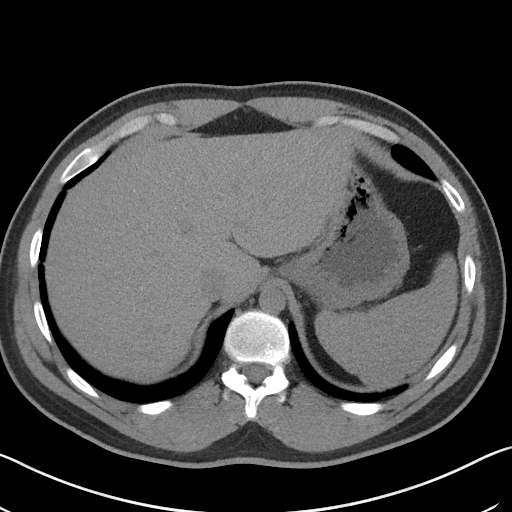
[im 97/101  soft-tissue]
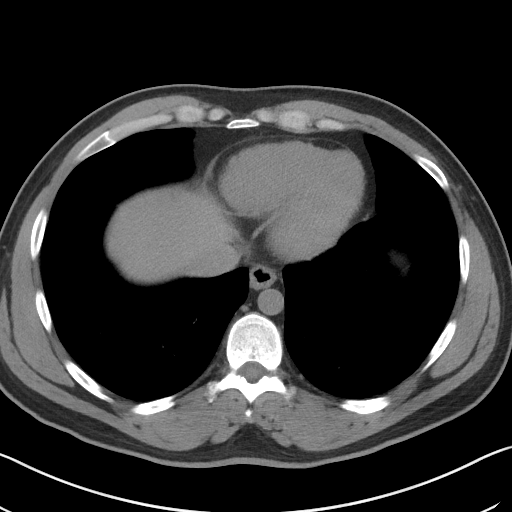

[Series 5: coronal · coronal · 0.77mm/px · 3 of 138 slices shown]
[im 46/138  soft-tissue]
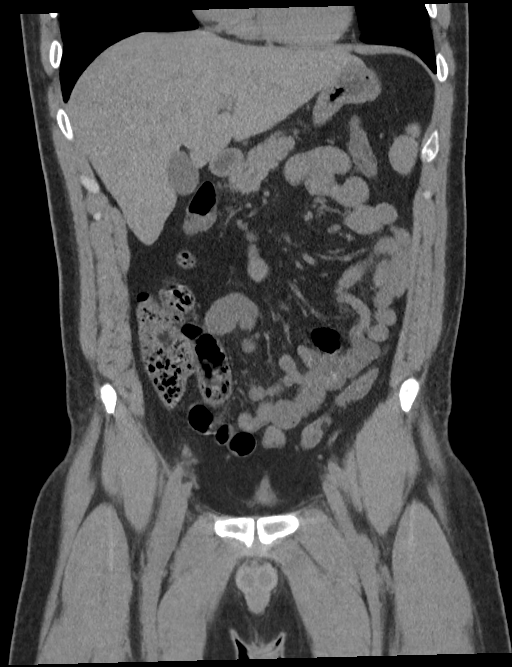
[im 61/138  soft-tissue]
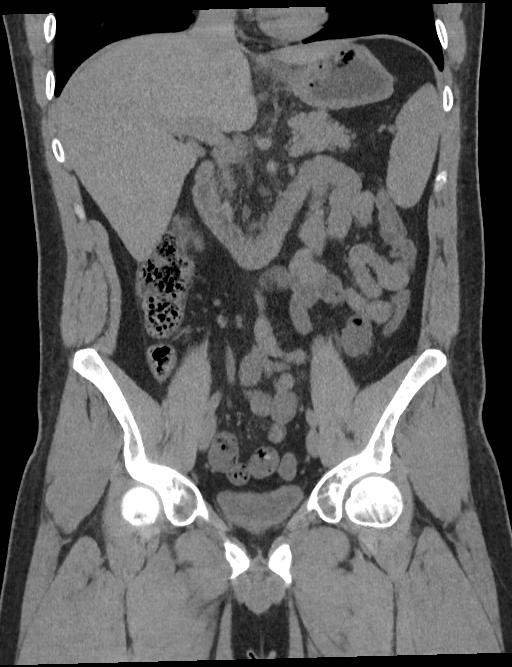
[im 77/138  soft-tissue]
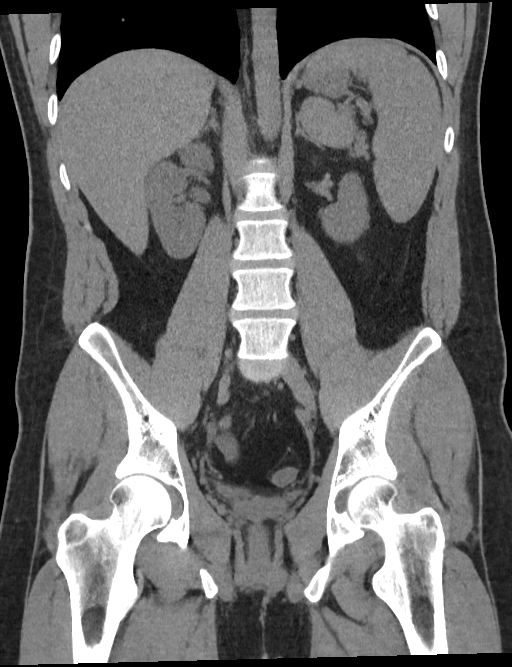

[16 of 46 positions shown; findings below may reference images not displayed]

FINDINGS: Lower chest: No acute abnormality.

Hepatobiliary: No focal liver abnormality is seen. No gallstones,
gallbladder wall thickening, or biliary dilatation.

Pancreas: Unremarkable. No pancreatic ductal dilatation or
surrounding inflammatory changes.

Spleen: The spleen measures 14.2 by 13.3 by 5.6 cm (volume = 550
cm^3). No focal splenic lesion identified.

Adrenals/Urinary Tract: Normal adrenal glands. The left kidney
appears normal. There is mild right nephromegaly and hydronephrosis
with hydroureter. Within the urinary bladder in the expected
location of the right UVJ there is a stone measuring 3 mm, image
76/2.

Stomach/Bowel: Stomach appears normal. The appendix is visualized
and is unremarkable. No bowel wall thickening, inflammation, or
distension.

Vascular/Lymphatic: No significant vascular findings are present. No
enlarged abdominal or pelvic lymph nodes.

Reproductive: Prostate is unremarkable.

Other: No free fluid or fluid collections identified small fat
containing umbilical hernia.

Musculoskeletal: No acute or significant osseous findings.
IMPRESSION: 1. Right-sided hydronephrosis and hydroureter secondary to 3 mm
right UVJ calculus.
2. Splenomegaly.

## 2021-12-21 ENCOUNTER — Other Ambulatory Visit: Payer: Self-pay | Admitting: Physician Assistant

## 2021-12-21 DIAGNOSIS — E039 Hypothyroidism, unspecified: Secondary | ICD-10-CM

## 2021-12-21 NOTE — Telephone Encounter (Signed)
Requested Prescriptions  Pending Prescriptions Disp Refills  . levothyroxine (SYNTHROID) 150 MCG tablet 90 tablet 3    Sig: Take 1 tablet (150 mcg total) by mouth daily.     Endocrinology:  Hypothyroid Agents Failed - 12/21/2021  1:56 PM      Failed - TSH in normal range and within 360 days    TSH  Date Value Ref Range Status  09/22/2021 5.230 (H) 0.450 - 4.500 uIU/mL Final         Passed - Valid encounter within last 12 months    Recent Outpatient Visits          2 months ago Mass of jaw   [facility] [place], [place], PA-C   3 months ago Acquired hypothyroidism   [facility], [place], PA-C   7 months ago Acquired hypothyroidism   [employer], Dani [place], PA-C   1 year ago Fever and chills   [place], PA-C   1 year ago Neck pain   [place], [place], [place]

## 2021-12-21 NOTE — Telephone Encounter (Signed)
Call pharmacy - pt has refills. Pharmacy will process refill.  Called pt - refill being prepared.

## 2021-12-21 NOTE — Telephone Encounter (Signed)
Pt would like levothyroxine (SYNTHROID) 150 MCG tablet sent to [id number] [place]  [address]  [place] [address]  Phone:  [phone]  Fax:  [phone]  DEA #:  [id number] Pt states he has been without this medication for 5 days

## 2022-10-07 ENCOUNTER — Other Ambulatory Visit: Payer: Self-pay | Admitting: Physician Assistant

## 2022-10-07 DIAGNOSIS — E039 Hypothyroidism, unspecified: Secondary | ICD-10-CM

## 2023-11-02 ENCOUNTER — Other Ambulatory Visit: Payer: Self-pay | Admitting: Physician Assistant

## 2023-11-02 DIAGNOSIS — E039 Hypothyroidism, unspecified: Secondary | ICD-10-CM

## 2023-11-06 ENCOUNTER — Other Ambulatory Visit: Payer: Self-pay | Admitting: Physician Assistant

## 2023-11-06 DIAGNOSIS — E039 Hypothyroidism, unspecified: Secondary | ICD-10-CM

## 2023-11-17 ENCOUNTER — Ambulatory Visit

## 2023-11-17 VITALS — BP 128/70 | HR 58 | Ht 71.0 in | Wt 202.0 lb

## 2023-11-17 DIAGNOSIS — R1110 Vomiting, unspecified: Secondary | ICD-10-CM | POA: Insufficient documentation

## 2023-11-17 DIAGNOSIS — R112 Nausea with vomiting, unspecified: Secondary | ICD-10-CM | POA: Diagnosis not present

## 2023-11-17 DIAGNOSIS — E039 Hypothyroidism, unspecified: Secondary | ICD-10-CM

## 2023-11-17 MED ORDER — LEVOTHYROXINE SODIUM 150 MCG PO TABS: 150.0000 ug | ORAL_TABLET | Freq: Every day | ORAL | 3 refills | Status: AC
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
## 2023-12-11 ENCOUNTER — Other Ambulatory Visit

## 2023-12-11 DIAGNOSIS — E039 Hypothyroidism, unspecified: Secondary | ICD-10-CM

## 2023-12-12 ENCOUNTER — Telehealth: Payer: Self-pay

## 2023-12-12 LAB — COMPREHENSIVE METABOLIC PANEL WITH GFR
AG Ratio: 2.1 (calc) (ref 1.0–2.5)
ALT: 28 U/L (ref 9–46)
AST: 22 U/L (ref 10–40)
Albumin: 5.1 g/dL (ref 3.6–5.1)
Alkaline phosphatase (APISO): 23 U/L — ABNORMAL LOW (ref 36–130)
BUN: 18 mg/dL (ref 7–25)
CO2: 31 mmol/L (ref 20–32)
Calcium: 10 mg/dL (ref 8.6–10.3)
Chloride: 102 mmol/L (ref 98–110)
Creat: 1.26 mg/dL (ref 0.60–1.26)
Globulin: 2.4 g/dL (ref 1.9–3.7)
Glucose, Bld: 89 mg/dL (ref 65–99)
Potassium: 4.2 mmol/L (ref 3.5–5.3)
Sodium: 141 mmol/L (ref 135–146)
Total Bilirubin: 0.7 mg/dL (ref 0.2–1.2)
Total Protein: 7.5 g/dL (ref 6.1–8.1)
eGFR: 77 mL/min/1.73m2 (ref >=60)

## 2023-12-12 LAB — LIPID PANEL
Cholesterol: 199 mg/dL (ref <200)
HDL: 56 mg/dL (ref >=40)
LDL Cholesterol (Calc): 122 mg/dL — ABNORMAL HIGH
Non-HDL Cholesterol (Calc): 143 mg/dL — ABNORMAL HIGH (ref <130)
Total CHOL/HDL Ratio: 3.6 (calc) (ref <5.0)
Triglycerides: 105 mg/dL (ref <150)

## 2023-12-12 LAB — URINALYSIS, ROUTINE W REFLEX MICROSCOPIC
Bilirubin Urine: NEGATIVE
Glucose, UA: NEGATIVE
Hgb urine dipstick: NEGATIVE
Ketones, ur: NEGATIVE
Leukocytes,Ua: NEGATIVE
Nitrite: NEGATIVE
Protein, ur: NEGATIVE
Specific Gravity, Urine: 1.01 (ref 1.001–1.035)
pH: 8 (ref 5.0–8.0)

## 2023-12-12 LAB — CBC WITH DIFFERENTIAL/PLATELET
Absolute Lymphocytes: 1208 {cells}/uL (ref 850–3900)
Absolute Monocytes: 419 {cells}/uL (ref 200–950)
Basophils Absolute: 32 {cells}/uL (ref 0–200)
Basophils Relative: 0.6 %
Eosinophils Absolute: 69 {cells}/uL (ref 15–500)
Eosinophils Relative: 1.3 %
HCT: 42.5 % (ref 38.5–50.0)
Hemoglobin: 14.1 g/dL (ref 13.2–17.1)
MCH: 30.6 pg (ref 27.0–33.0)
MCHC: 33.2 g/dL (ref 32.0–36.0)
MCV: 92.2 fL (ref 80.0–100.0)
MPV: 13.4 fL — ABNORMAL HIGH (ref 7.5–12.5)
Monocytes Relative: 7.9 %
Neutro Abs: 3572 {cells}/uL (ref 1500–7800)
Neutrophils Relative %: 67.4 %
Platelets: 151 Thousand/uL (ref 140–400)
RBC: 4.61 Million/uL (ref 4.20–5.80)
RDW: 11.7 % (ref 11.0–15.0)
Total Lymphocyte: 22.8 %
WBC: 5.3 Thousand/uL (ref 3.8–10.8)

## 2023-12-12 LAB — HEMOGLOBIN A1C
Hgb A1c MFr Bld: 5.1 % (ref <5.7)
Mean Plasma Glucose: 100 mg/dL
eAG (mmol/L): 5.5 mmol/L

## 2023-12-12 LAB — T4, FREE: Free T4: 1.5 ng/dL (ref 0.8–1.8)

## 2023-12-12 LAB — TSH+FREE T4: TSH W/REFLEX TO FT4: 9.65 m[IU]/L — ABNORMAL HIGH (ref 0.40–4.50)

## 2023-12-12 NOTE — Telephone Encounter (Signed)
 Copied from CRM [id number]. Topic: Clinical - Lab/Test Results >> Dec 12, 2023  1:18 PM Delon DASEN wrote: Reason for CRM: calling for lab results- [id number]

## 2023-12-13 NOTE — Telephone Encounter (Signed)
 Patient notified labs would be reviewed at his appointment on 9/15

## 2023-12-18 ENCOUNTER — Ambulatory Visit (INDEPENDENT_AMBULATORY_CARE_PROVIDER_SITE_OTHER)

## 2023-12-18 ENCOUNTER — Ambulatory Visit
Admission: RE | Admit: 2023-12-18 | Discharge: 2023-12-18 | Disposition: A | Discharge status: Home / Self Care | Source: Ambulatory Visit

## 2023-12-18 VITALS — BP 120/78 | HR 68 | Temp 98.6°F | Wt 199.0 lb

## 2023-12-18 DIAGNOSIS — R112 Nausea with vomiting, unspecified: Secondary | ICD-10-CM | POA: Insufficient documentation

## 2023-12-18 DIAGNOSIS — E739 Lactose intolerance, unspecified: Secondary | ICD-10-CM | POA: Insufficient documentation

## 2023-12-18 DIAGNOSIS — M79643 Pain in unspecified hand: Secondary | ICD-10-CM | POA: Insufficient documentation

## 2023-12-18 DIAGNOSIS — M79641 Pain in right hand: Secondary | ICD-10-CM

## 2023-12-18 DIAGNOSIS — E039 Hypothyroidism, unspecified: Secondary | ICD-10-CM | POA: Insufficient documentation

## 2023-12-18 DIAGNOSIS — F1510 Other stimulant abuse, uncomplicated: Secondary | ICD-10-CM | POA: Insufficient documentation

## 2023-12-18 MED ORDER — OMEPRAZOLE 20 MG PO CPDR: 20.0000 mg | DELAYED_RELEASE_CAPSULE | Freq: Every day | ORAL | 3 refills | Status: AC

## 2023-12-18 NOTE — Progress Notes (Signed)
 Progress Note  Physician: Rorie Delmore A Shamir Tuzzolino, MD   HPI: Luis Bennett is a 33 y.o. male presenting on 12/18/2023 for Follow-up .  Discussed the use of AI scribe software for clinical note transcription with the patient, who gave verbal consent to proceed.  History of Present Illness   [name] is a 33 year old male with hypothyroidism who presents with persistent fatigue and weight loss.  Fatigue and sleep disturbance - Persistent fatigue despite resuming levothyroxine  (Synthroid  150 mcg daily) - Sleep disruption with frequent awakening at 3 AM despite going to bed at 9:30 PM - Feels improved since restarting levothyroxine  but continues to experience fatigue  Unintentional weight loss - Weight decreased from 230 lbs to 199 lbs over an unspecified period - No active attempts at weight loss - Dietary changes include cutting out sugars, breads, and pasta - Consuming junk food in an attempt to regain weight  Headache - Frequent headaches with pain localized to the eyeballs - Uses Tylenol  for relief, taking four to six tablets weekly  Gastrointestinal symptoms - Nausea and occasional vomiting, particularly after consuming certain foods such as a chocolate waffle bar - Vomiting occurs approximately once a week - No abdominal pain - Nausea typically precedes vomiting  Stimulant use - Consumes two to three sugar-free Monster energy drinks daily - No coffee consumption - Uses energy drinks to maintain alertness  Lactose intolerance - History of lactose intolerance with stomach pain after dairy consumption - Avoids dairy products - Does not take vitamin D supplements  Right middle finger pain and limited range of motion - Chronic pain and limited range of motion in the right middle finger, believed to be due to a dislocation three years ago - Pain and functional limitation, especially with gripping objects - Worsening symptoms over the past  three to four months  Thyroid function laboratory findings - Recent TSH: 9.65 - Recent T4: 1.5        Medical history:  Relevant past medical, surgical, family and social history reviewed and updated as indicated. Interim medical history since our last visit reviewed.  Allergies and medications reviewed and updated.   ROS: Negative unless specifically indicated above in HPI.    Current Outpatient Medications:    omeprazole  (PRILOSEC) 20 MG capsule, Take 1 capsule (20 mg total) by mouth daily., Disp: 30 capsule, Rfl: 3   levothyroxine  (SYNTHROID ) 150 MCG tablet, Take 1 tablet (150 mcg total) by mouth daily., Disp: 90 tablet, Rfl: 3       Objective:     BP 120/78 (BP Location: Right Arm, Patient Position: Sitting, Cuff Size: Normal)   Pulse 68   Temp 98.6 F (37 C) (Oral)   Wt 199 lb (90.3 kg)   BMI 27.75 kg/m   Wt Readings from Last 3 Encounters:  12/18/23 199 lb (90.3 kg)  11/17/23 202 lb (91.6 kg)  10/04/21 228 lb 11.2 oz (103.7 kg)    Physical Exam  Physical Exam Vitals reviewed.  Constitutional:      Appearance: Normal appearance. Well-developed with normal weight.  Cardiovascular:     Rate and Rhythm: Normal rate and regular rhythm. Normal heart sounds. Normal peripheral pulses Pulmonary:     Normal breath sounds with normal effort Skin:    General: Skin is warm and dry without noticeable rash. Neurological:     General: No focal deficit present.  Psychiatric:  Mood and Affect: Mood, behavior and cognition normal      Assessment & Plan:   Encounter Diagnoses  Name Primary?   Acquired hypothyroidism Yes   Nausea and vomiting, unspecified vomiting type    Hand pain, right    Pain of right hand    Lactose intolerance    Excessive caffeine abuse, continuous (HCC)     Orders Placed This Encounter  Procedures   DG Hand Complete Right   TSH + free T4     Assessment and Plan    Hypothyroidism with abnormal thyroid function tests and  possible over-replacement Hypothyroidism with elevated TSH at 9.65 and normal T4 at 1.5. Symptoms include fatigue, weight loss, and sleep disturbances. Possible over-replacement with levothyroxine  150 mcg resumed after 2-3 months without medication.  - Order TSH and T4 labs to reassess thyroid function today - Consider adjusting levothyroxine  dose based on lab results. - Schedule follow-up to discuss lab results and symptoms.  Unintentional weight loss Unintentional weight loss from 230 lbs to 199 lbs over an unspecified period. Not actively trying to lose weight. Possible contribution from thyroid dysfunction or dietary changes. - Monitor weight and reassess after thyroid function is stabilized.  Adverse effects from excessive caffeine intake Excessive caffeine intake from 2-3 sugar-free Monster energy drinks daily, leading to potential caffeine toxicity. Symptoms include nausea, vomiting, sleep disturbances, and potential cardiac arrhythmias. Caffeine intake exceeds recommended maximum of 400 mg/day. Risks include arrhythmias, gastrointestinal symptoms, and potential caffeine poisoning. - Advise reducing energy drink consumption to a maximum of 2 per day, with a goal to reduce to 1 per day. - Educate on risks of excessive caffeine intake, including arrhythmias and gastrointestinal symptoms.  Headache Increased frequency of headaches, possibly related to excessive caffeine intake or thyroid dysfunction. No associated hypertension noted. - Advise reducing caffeine intake as a potential trigger for headaches.  Nausea and vomiting Intermittent nausea and vomiting, possibly related to excessive caffeine intake or gastrointestinal irritation. No associated stomach pain reported. - Prescribe medication to settle stomach symptoms. - Advise reducing caffeine intake to alleviate symptoms.  - Omeprazole  20 mg daily  Lactose intolerance Lactose intolerance with symptoms of nausea and stomach pain  after dairy consumption. Avoidance of dairy products noted. - Recommend lactase supplements or lactose-free dairy products. - Advise on dietary sources of calcium and vitamin D, such as dark green leafy vegetables and fortified foods. - Prescribe vitamin D 2000 IU daily.  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 Symptoms include pain and limited range of motion, worsening over the past 3-4 months. - Order x-ray of the right middle finger to assess for fracture or other bony abnormalities.        Weight loss requires monitoring in the future and we should see symptoms improve.  Otherwise, further workup and evaluation necessary.  Fu phone visit in 1 week

## 2023-12-19 LAB — T4, FREE: Free T4: 1.5 ng/dL (ref 0.8–1.8)

## 2023-12-19 LAB — TSH+FREE T4: TSH W/REFLEX TO FT4: 5.35 m[IU]/L — ABNORMAL HIGH (ref 0.40–4.50)

## 2023-12-27 ENCOUNTER — Telehealth (INDEPENDENT_AMBULATORY_CARE_PROVIDER_SITE_OTHER)

## 2023-12-27 ENCOUNTER — Telehealth: Payer: Self-pay

## 2023-12-27 DIAGNOSIS — E039 Hypothyroidism, unspecified: Secondary | ICD-10-CM

## 2023-12-27 DIAGNOSIS — M79641 Pain in right hand: Secondary | ICD-10-CM | POA: Diagnosis not present

## 2023-12-27 DIAGNOSIS — F1510 Other stimulant abuse, uncomplicated: Secondary | ICD-10-CM | POA: Diagnosis not present

## 2023-12-27 DIAGNOSIS — R5383 Other fatigue: Secondary | ICD-10-CM | POA: Insufficient documentation

## 2023-12-27 NOTE — Progress Notes (Signed)
            Progress Note  Physician: Saphia Vanderford A Nneka Blanda, MD   Patient contacted 12/27/23 at 10:40 AM EDT by a video enabled telemedicine application and verified that I am speaking with the correct person.   Patient is aware of limitations of evaluation by telemedicine The patient expressed understanding and agreed to proceed.   HPI: Luis Bennett is a 33 y.o. male presenting on 12/27/2023 for No chief complaint on file. . Patient seen in follow-up.  He has a history of hypothyroidism restarted back on levothyroxine .  Labs showing improvement in his overall TSH and he is feeling better in terms of energy.  He had been having some stomach issues with nausea which we attributed to excess use of monster with caffeine.  He has decreased his intake of this and stomach issues have improved.  He has not been taking omeprazole .  Patient had been having pain in the proximal third digit of the right hand and radial area.  Radial styloid fracture with ossification issue on on imaging.  He continues to have pain in both areas.  Patient did have a distant traumatic injury.  No numbness or weakness of the hand noted.  Otherwise, patient having issues with sleep.  He finds that he is waking up feeling exhausted and this has been constant over the last weeks.  No history for sleep apnea he is not sure if he is snoring since he sleeps alone.  When he sleeps and on the weekends a number of hours he does not feel that sleep quality is improved.  Chronic fatigue despite adequate sleep and diet.   Social history:  Relevant past medical, surgical, family and social history reviewed and updated as indicated. Interim medical history since our last visit reviewed.  Allergies and medications reviewed and updated.  DATA REVIEWED: CHART IN EPIC  ROS: Negative unless specifically indicated above in HPI.    Current Outpatient Medications:    levothyroxine  (SYNTHROID ) 150 MCG tablet, Take 1 tablet (150 mcg  total) by mouth daily., Disp: 90 tablet, Rfl: 3   omeprazole  (PRILOSEC) 20 MG capsule, Take 1 capsule (20 mg total) by mouth daily., Disp: 30 capsule, Rfl: 3      Objective:  Telephone visit     Assessment & Plan:  Acquired hypothyroidism  Excessive caffeine abuse, continuous (HCC)  Pain of right hand  Other fatigue  #1 hypothyroidism.  TSH seems to be normalizing.  Continue with Synthroid  on current dose of 150 mcg daily.  Will check levels about 6 months down the road.  2.  Fatigue.  He should have home sleep study given lack of restorative sleep.  3.  Hand pain.  He has a distal radial styloid fracture with continued pain in his hand.  Will have him visit with orthopedics.  Patient needs physical filled out on Monday so we will schedule him in follow-up.  No follow-ups on file.

## 2023-12-29 ENCOUNTER — Ambulatory Visit

## 2024-01-05 NOTE — Telephone Encounter (Signed)
 Mychart message

## 2024-03-24 ENCOUNTER — Telehealth: Admitting: Physician Assistant

## 2024-03-24 DIAGNOSIS — Z20828 Contact with and (suspected) exposure to other viral communicable diseases: Secondary | ICD-10-CM | POA: Diagnosis not present

## 2024-03-24 MED ORDER — OSELTAMIVIR PHOSPHATE 75 MG PO CAPS: 75.0000 mg | ORAL_CAPSULE | Freq: Two times a day (BID) | ORAL | 0 refills | Status: AC

## 2024-03-24 MED ORDER — BENZONATATE 100 MG PO CAPS: 100.0000 mg | ORAL_CAPSULE | Freq: Two times a day (BID) | ORAL | 0 refills | Status: AC | PRN

## 2024-03-24 MED ORDER — FLUTICASONE PROPIONATE 50 MCG/ACT NA SUSP: 2.0000 | Freq: Every day | NASAL | 6 refills | Status: AC

## 2024-03-24 MED ORDER — LIDOCAINE VISCOUS HCL 2 % MT SOLN: OROMUCOSAL | 0 refills | Status: AC

## 2024-03-24 NOTE — Progress Notes (Signed)
 E visit for Flu like symptoms   We are sorry that you are not feeling well.  Here is how we plan to help! Based on what you have shared with me it looks like you may have possible exposure to a virus that causes influenza.  Influenza or the flu is  an infection caused by a respiratory virus. The flu virus is highly contagious and persons who did not receive their yearly flu vaccination may catch the flu from close contact.  We have anti-viral medications to treat the viruses that cause this infection. They are not a cure and only shorten the course of the infection. These prescriptions are most effective when they are given within the first 2 days of flu symptoms. Antiviral medications are indicated if you have a high risk of complications from the flu. You should  also consider an antiviral medication if you are in close contact with someone who is at risk. These medications can help patients avoid complications from the flu but have side effects that you should know.   Possible side effects from Tamiflu  or oseltamivir  include nausea, vomiting, diarrhea, dizziness, headaches, eye redness, sleep problems or other respiratory symptoms. You should not take Tamiflu  if you have an allergy to oseltamivir  or any to the ingredients in Tamiflu .  Based upon your symptoms and potential risk factors I have prescribed Oseltamivir  (Tamiflu ).  It has been sent to your designated pharmacy.  You will take one 75 mg capsule orally twice a day for the next 5 days.   For nasal congestion, you may use an oral decongestant such as Mucinex D or if you have glaucoma or high blood pressure use plain Mucinex.  Saline nasal spray or nasal drops can help and can safely be used as often as needed for congestion.  If you have a sore or scratchy throat, use a saltwater gargle-  to  teaspoon of salt dissolved in a 4-ounce to 8-ounce glass of warm water.  Gargle the solution for approximately 15-30 seconds and then spit.   It is important not to swallow the solution.  You can also use throat lozenges/cough drops and Chloraseptic spray to help with throat pain or discomfort.  Warm or cold liquids can also be helpful in relieving throat pain.  For headache, pain or general discomfort, you can use Ibuprofen or Tylenol as directed.   Some authorities believe that zinc sprays or the use of Echinacea may shorten the course of your symptoms.  I have prescribed the following medications to help lessen symptoms: I have prescribed Tessalon  Perles 100 mg. You may take 1-2 capsules every 8 hours as needed for cough and I have prescribed Fluticasone  nasal spray 2 sprays in each nostril one time per dayasal spray 2 sprays in each nostril one time per day  You are to isolate at home until you have been fever-free for at least 24 hours without a fever-reducing medication, and symptoms have been steadily improving for 24 hours.  If you must be around other household members who do not have symptoms, you need to make sure that both you and the family members are masking consistently with a high-quality mask.  If you note any worsening of symptoms despite treatment, please seek an in-person evaluation ASAP. If you note any significant shortness of breath or any chest pain, please seek ED evaluation. Please do not delay care!  ANYONE WHO HAS FLU SYMPTOMS SHOULD: Stay home. The flu is highly contagious and going out or to  work exposes others! Be sure to drink plenty of fluids. Water is fine as well as fruit juices, sodas and electrolyte beverages. You may want to stay away from caffeine or alcohol. If you are nauseated, try taking small sips of liquids. How do you know if you are getting enough fluid? Your urine should be a pale yellow or almost colorless. Get rest. Taking a steamy shower or using a humidifier may help nasal congestion and ease sore throat pain. Using a saline nasal spray works much the same way. Cough drops, hard candies  and sore throat lozenges may ease your cough. Line up a caregiver. Have someone check on you regularly.  GET HELP RIGHT AWAY IF: You cannot keep down liquids or your medications. You become short of breath Your fell like you are going to pass out or loose consciousness. Your symptoms persist after you have completed your treatment plan  MAKE SURE YOU  Understand these instructions. Will watch your condition. Will get help right away if you are not doing well or get worse.  Your e-visit answers were reviewed by a board certified advanced clinical practitioner to complete your personal care plan.  Depending on the condition, your plan could have included both over the counter or prescription medications.  If there is a problem please reply  once you have received a response from your provider.  Your safety is important to us .  If you have drug allergies check your prescription carefully.    You can use MyChart to ask questions about todays visit, request a non-urgent call back, or ask for a work or school excuse for 24 hours related to this e-Visit. If it has been greater than 24 hours you will need to follow up with your provider, or enter a new e-Visit to address those concerns.  You will get an e-mail in the next two days asking about your experience.  I hope that your e-visit has been valuable and will speed your recovery. Thank you for using e-visits.   I have spent 5 minutes in review of e-visit questionnaire, review and updating patient chart, medical decision making and response to patient.   Teena Shuck, PA-C
